# Patient Record
Sex: Male | Born: 1970 | Race: Black or African American | Hispanic: No | Marital: Married | Smoking: Current every day smoker
Health system: Southern US, Community
[De-identification: ages and names within clinical notes are randomized; demographics above are authoritative.]

## PROBLEM LIST (undated history)

## (undated) DIAGNOSIS — I10 Essential (primary) hypertension: Secondary | ICD-10-CM

## (undated) DIAGNOSIS — I639 Cerebral infarction, unspecified: Secondary | ICD-10-CM

---

## 2015-08-13 ENCOUNTER — Emergency Department: Payer: Self-pay

## 2015-08-13 ENCOUNTER — Emergency Department
Admission: EM | Admit: 2015-08-13 | Discharge: 2015-08-13 | Disposition: A | Discharge status: Home / Self Care | Payer: Self-pay | Attending: Emergency Medicine | Admitting: Emergency Medicine

## 2015-08-13 ENCOUNTER — Encounter: Payer: Self-pay | Admitting: *Deleted

## 2015-08-13 DIAGNOSIS — F17210 Nicotine dependence, cigarettes, uncomplicated: Secondary | ICD-10-CM | POA: Insufficient documentation

## 2015-08-13 DIAGNOSIS — R079 Chest pain, unspecified: Secondary | ICD-10-CM | POA: Insufficient documentation

## 2015-08-13 LAB — BASIC METABOLIC PANEL
ANION GAP: 5 (ref 5–15)
BUN: 12 mg/dL (ref 6–20)
CALCIUM: 8.7 mg/dL — AB (ref 8.9–10.3)
CO2: 31 mmol/L (ref 22–32)
CREATININE: 1.11 mg/dL (ref 0.61–1.24)
Chloride: 105 mmol/L (ref 101–111)
Glucose, Bld: 84 mg/dL (ref 65–99)
Potassium: 3.6 mmol/L (ref 3.5–5.1)
Sodium: 141 mmol/L (ref 135–145)

## 2015-08-13 LAB — TROPONIN I: TROPONIN I: 0.03 ng/mL (ref <0.031)

## 2015-08-13 LAB — CBC
HCT: 40 % (ref 40.0–52.0)
HEMOGLOBIN: 13.1 g/dL (ref 13.0–18.0)
MCH: 25.6 pg — AB (ref 26.0–34.0)
MCHC: 32.7 g/dL (ref 32.0–36.0)
MCV: 78.1 fL — ABNORMAL LOW (ref 80.0–100.0)
PLATELETS: 232 10*3/uL (ref 150–440)
RBC: 5.13 MIL/uL (ref 4.40–5.90)
RDW: 15.9 % — ABNORMAL HIGH (ref 11.5–14.5)
WBC: 15.1 10*3/uL — ABNORMAL HIGH (ref 3.8–10.6)

## 2015-08-13 MED ORDER — LORAZEPAM 1 MG PO TABS: 1.0000 mg | ORAL_TABLET | Freq: Two times a day (BID) | ORAL | Status: AC

## 2015-08-13 NOTE — Discharge Instructions (Signed)
Nonspecific Chest Pain  °Chest pain can be caused by many different conditions. There is always a chance that your pain could be related to something serious, such as a heart attack or a blood clot in your lungs. Chest pain can also be caused by conditions that are not life-threatening. If you have chest pain, it is very important to follow up with your health care provider. °CAUSES  °Chest pain can be caused by: °· Heartburn. °· Pneumonia or bronchitis. °· Anxiety or stress. °· Inflammation around your heart (pericarditis) or lung (pleuritis or pleurisy). °· A blood clot in your lung. °· A collapsed lung (pneumothorax). It can develop suddenly on its own (spontaneous pneumothorax) or from trauma to the chest. °· Shingles infection (varicella-zoster virus). °· Heart attack. °· Damage to the bones, muscles, and cartilage that make up your chest wall. This can include: °¨ Bruised bones due to injury. °¨ Strained muscles or cartilage due to frequent or repeated coughing or overwork. °¨ Fracture to one or more ribs. °¨ Sore cartilage due to inflammation (costochondritis). °RISK FACTORS  °Risk factors for chest pain may include: °· Activities that increase your risk for trauma or injury to your chest. °· Respiratory infections or conditions that cause frequent coughing. °· Medical conditions or overeating that can cause heartburn. °· Heart disease or family history of heart disease. °· Conditions or health behaviors that increase your risk of developing a blood clot. °· Having had chicken pox (varicella zoster). °SIGNS AND SYMPTOMS °Chest pain can feel like: °· Burning or tingling on the surface of your chest or deep in your chest. °· Crushing, pressure, aching, or squeezing pain. °· Dull or sharp pain that is worse when you move, cough, or take a deep breath. °· Pain that is also felt in your back, neck, shoulder, or arm, or pain that spreads to any of these areas. °Your chest pain may come and go, or it may stay  constant. °DIAGNOSIS °Lab tests or other studies may be needed to find the cause of your pain. Your health care provider may have you take a test called an ambulatory ECG (electrocardiogram). An ECG records your heartbeat patterns at the time the test is performed. You may also have other tests, such as: °· Transthoracic echocardiogram (TTE). During echocardiography, sound waves are used to create a picture of all of the heart structures and to look at how blood flows through your heart. °· Transesophageal echocardiogram (TEE). This is a more advanced imaging test that obtains images from inside your body. It allows your health care provider to see your heart in finer detail. °· Cardiac monitoring. This allows your health care provider to monitor your heart rate and rhythm in real time. °· Holter monitor. This is a portable device that records your heartbeat and can help to diagnose abnormal heartbeats. It allows your health care provider to track your heart activity for several days, if needed. °· Stress tests. These can be done through exercise or by taking medicine that makes your heart beat more quickly. °· Blood tests. °· Imaging tests. °TREATMENT  °Your treatment depends on what is causing your chest pain. Treatment may include: °· Medicines. These may include: °¨ Acid blockers for heartburn. °¨ Anti-inflammatory medicine. °¨ Pain medicine for inflammatory conditions. °¨ Antibiotic medicine, if an infection is present. °¨ Medicines to dissolve blood clots. °¨ Medicines to treat coronary artery disease. °· Supportive care for conditions that do not require medicines. This may include: °¨ Resting. °¨ Applying heat   or cold packs to injured areas. °¨ Limiting activities until pain decreases. °HOME CARE INSTRUCTIONS °· If you were prescribed an antibiotic medicine, finish it all even if you start to feel better. °· Avoid any activities that bring on chest pain. °· Do not use any tobacco products, including  cigarettes, chewing tobacco, or electronic cigarettes. If you need help quitting, ask your health care provider. °· Do not drink alcohol. °· Take medicines only as directed by your health care provider. °· Keep all follow-up visits as directed by your health care provider. This is important. This includes any further testing if your chest pain does not go away. °· If heartburn is the cause for your chest pain, you may be told to keep your head raised (elevated) while sleeping. This reduces the chance that acid will go from your stomach into your esophagus. °· Make lifestyle changes as directed by your health care provider. These may include: °¨ Getting regular exercise. Ask your health care provider to suggest some activities that are safe for you. °¨ Eating a heart-healthy diet. A registered dietitian can help you to learn healthy eating options. °¨ Maintaining a healthy weight. °¨ Managing diabetes, if necessary. °¨ Reducing stress. °SEEK MEDICAL CARE IF: °· Your chest pain does not go away after treatment. °· You have a rash with blisters on your chest. °· You have a fever. °SEEK IMMEDIATE MEDICAL CARE IF:  °· Your chest pain is worse. °· You have an increasing cough, or you cough up blood. °· You have severe abdominal pain. °· You have severe weakness. °· You faint. °· You have chills. °· You have sudden, unexplained chest discomfort. °· You have sudden, unexplained discomfort in your arms, back, neck, or jaw. °· You have shortness of breath at any time. °· You suddenly start to sweat, or your skin gets clammy. °· You feel nauseous or you vomit. °· You suddenly feel light-headed or dizzy. °· Your heart begins to beat quickly, or it feels like it is skipping beats. °These symptoms may represent a serious problem that is an emergency. Do not wait to see if the symptoms will go away. Get medical help right away. Call your local emergency services (911 in the [place] Do not drive yourself to the hospital. °  °This  information is not intended to replace advice given to you by your health care provider. Make sure you discuss any questions you have with your health care provider. °  °Document Released: 03/24/2005 Document Revised: 07/05/2014 Document Reviewed: 01/18/2014 °Elsevier Interactive Patient Education ©2016 [employer] ° °

## 2015-08-13 NOTE — ED Notes (Signed)
Pt in via triage with complaints of intermittent chest pain on left side which radiates to left shoulder/arm.  Pt denies any other symptoms.  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  MD at bedside.

## 2015-08-13 NOTE — ED Notes (Signed)
Pt reports having episodes of chest pain for the last month, pt denies any other symptoms, pt describes pain as left sided chest pain

## 2015-08-13 NOTE — ED Provider Notes (Signed)
[facility] Emergency Department Provider Note     Time seen: ----------------------------------------- 2:29 PM on 08/13/2015 -----------------------------------------    I have reviewed the triage vital signs and the nursing notes.   HISTORY  Chief Complaint Chest Pain    HPI Gordon Freeman is a 45 y.o. 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 Nothing makes his symptoms better or worse, has had recent viral illness with cough. Patient denies any significant chest pain at this time.   History reviewed. No pertinent past medical history.  There are no active problems to display for this patient.   History reviewed. No pertinent past surgical history.  Allergies Review of patient's allergies indicates no known allergies.  Social History Social History  Substance Use Topics  . Smoking status: Current Every Day Smoker -- 1.00 packs/day    Types: Cigarettes  . Smokeless tobacco: None  . Alcohol Use: No    Review of Systems Constitutional: Negative for fever. Eyes: Negative for visual changes. ENT: Negative for sore throat. Cardiovascular: Positive for chest pain Respiratory: Negative for shortness of breath. Gastrointestinal: Negative for abdominal pain, vomiting and diarrhea. Genitourinary: Negative for dysuria. Musculoskeletal: Negative for back pain. Skin: Negative for rash. Neurological: Negative for headaches, focal weakness or numbness.  10-point ROS otherwise negative.  ____________________________________________   PHYSICAL EXAM:  VITAL SIGNS: ED Triage Vitals  Enc Vitals Group     BP 08/13/15 1211 158/80 mmHg     Pulse Rate 08/13/15 1211 85     Resp 08/13/15 1211 20     Temp 08/13/15 1211 99.5 F (37.5 C)     Temp Source 08/13/15 1211 Oral     SpO2 08/13/15 1211 98 %     Weight 08/13/15 1211 335  lb (151.955 kg)     Height 08/13/15 1211  (1.88 m)     Head Cir --      Peak Flow --      Pain Score 08/13/15 1216 4     Pain Loc --      Pain Edu? --      Excl. in GC? --     Constitutional: Alert and oriented. Well appearing and in no distress. Eyes: Conjunctivae are normal. PERRL. Normal extraocular movements. ENT   Head: Normocephalic and atraumatic.   Nose: No congestion/rhinnorhea.   Mouth/Throat: Mucous membranes are moist.   Neck: No stridor. Cardiovascular: Normal rate, regular rhythm. Normal and symmetric distal pulses are present in all extremities. No murmurs, rubs, or gallops. Respiratory: Normal respiratory effort without tachypnea nor retractions. Breath sounds are clear and equal bilaterally. No wheezes/rales/rhonchi. Gastrointestinal: Soft and nontender. No distention. No abdominal bruits.  Musculoskeletal: Nontender with normal range of motion in all extremities. No joint effusions.  No lower extremity tenderness nor edema. Neurologic:  Normal speech and language. No gross focal neurologic deficits are appreciated.  Skin:  Skin is warm, dry and intact. No rash noted. Psychiatric: Mood and affect are normal. Speech and behavior are normal. Patient exhibits appropriate insight and judgment. ____________________________________________  EKG: Interpreted by me. Normal sinus rhythm rate 84 bpm, normal PR interval, normal QRS, normal QT interval. Normal axis. Nonspecific T-wave changes.  ____________________________________________  ED COURSE:  Pertinent labs & imaging results that were available during my care of the patient were reviewed by me and considered in my medical decision making (see chart for  details). Patient is no acute distress, will check cardiac labs and reevaluate. ____________________________________________    LABS (pertinent positives/negatives)  Labs Reviewed  BASIC METABOLIC PANEL - Abnormal; Notable for the following:     Calcium 8.7 (*)    All other components within normal limits  CBC - Abnormal; Notable for the following:    WBC 15.1 (*)    MCV 78.1 (*)    MCH 25.6 (*)    RDW 15.9 (*)    All other components within normal limits  TROPONIN I    RADIOLOGY  Chest x-ray is unremarkable  ____________________________________________  FINAL ASSESSMENT AND PLAN  Chest pain  Plan: Patient with labs and imaging as dictated above. Patient is in no acute distress, is low risk for ACS. Heart score is low. He'll be discharged with outpatient referral to a cardiologist.   Emily Filbert, MD   Emily Filbert, MD 08/13/15 [phone]

## 2016-11-09 ENCOUNTER — Emergency Department: Payer: Self-pay

## 2016-11-09 ENCOUNTER — Encounter: Payer: Self-pay | Admitting: Emergency Medicine

## 2016-11-09 ENCOUNTER — Inpatient Hospital Stay
Admission: EM | Admit: 2016-11-09 | Discharge: 2016-11-10 | DRG: 066 | Disposition: A | Discharge status: Home / Self Care | Payer: Self-pay | Attending: Internal Medicine | Admitting: Internal Medicine

## 2016-11-09 DIAGNOSIS — F17210 Nicotine dependence, cigarettes, uncomplicated: Secondary | ICD-10-CM | POA: Diagnosis present

## 2016-11-09 DIAGNOSIS — R29810 Facial weakness: Secondary | ICD-10-CM | POA: Diagnosis present

## 2016-11-09 DIAGNOSIS — I10 Essential (primary) hypertension: Secondary | ICD-10-CM | POA: Diagnosis present

## 2016-11-09 DIAGNOSIS — Z9114 Patient's other noncompliance with medication regimen: Secondary | ICD-10-CM

## 2016-11-09 DIAGNOSIS — E876 Hypokalemia: Secondary | ICD-10-CM | POA: Diagnosis present

## 2016-11-09 DIAGNOSIS — R4781 Slurred speech: Secondary | ICD-10-CM | POA: Diagnosis present

## 2016-11-09 DIAGNOSIS — Z823 Family history of stroke: Secondary | ICD-10-CM

## 2016-11-09 DIAGNOSIS — R278 Other lack of coordination: Secondary | ICD-10-CM | POA: Diagnosis present

## 2016-11-09 DIAGNOSIS — I639 Cerebral infarction, unspecified: Principal | ICD-10-CM | POA: Diagnosis present

## 2016-11-09 DIAGNOSIS — R531 Weakness: Secondary | ICD-10-CM

## 2016-11-09 HISTORY — DX: Essential (primary) hypertension: I10

## 2016-11-09 LAB — PROTIME-INR
INR: 1.05
Prothrombin Time: 13.7 seconds (ref 11.4–15.2)

## 2016-11-09 LAB — COMPREHENSIVE METABOLIC PANEL
ALK PHOS: 70 U/L (ref 38–126)
ALT: 19 U/L (ref 17–63)
ANION GAP: 7 (ref 5–15)
AST: 24 U/L (ref 15–41)
Albumin: 4.5 g/dL (ref 3.5–5.0)
BILIRUBIN TOTAL: 0.5 mg/dL (ref 0.3–1.2)
BUN: 16 mg/dL (ref 6–20)
CALCIUM: 9.4 mg/dL (ref 8.9–10.3)
CO2: 26 mmol/L (ref 22–32)
Chloride: 107 mmol/L (ref 101–111)
Creatinine, Ser: 1.1 mg/dL (ref 0.61–1.24)
Glucose, Bld: 115 mg/dL — ABNORMAL HIGH (ref 65–99)
POTASSIUM: 3.4 mmol/L — AB (ref 3.5–5.1)
Sodium: 140 mmol/L (ref 135–145)
TOTAL PROTEIN: 7.8 g/dL (ref 6.5–8.1)

## 2016-11-09 LAB — DIFFERENTIAL
Basophils Absolute: 0.2 10*3/uL — ABNORMAL HIGH (ref 0–0.1)
Basophils Relative: 1 %
EOS ABS: 0.2 10*3/uL (ref 0–0.7)
EOS PCT: 1 %
LYMPHS ABS: 3 10*3/uL (ref 1.0–3.6)
LYMPHS PCT: 25 %
MONO ABS: 0.8 10*3/uL (ref 0.2–1.0)
MONOS PCT: 7 %
Neutro Abs: 8.1 10*3/uL — ABNORMAL HIGH (ref 1.4–6.5)
Neutrophils Relative %: 66 %

## 2016-11-09 LAB — CBC
HCT: 40.9 % (ref 40.0–52.0)
HEMOGLOBIN: 13.3 g/dL (ref 13.0–18.0)
MCH: 25.7 pg — AB (ref 26.0–34.0)
MCHC: 32.5 g/dL (ref 32.0–36.0)
MCV: 79.2 fL — AB (ref 80.0–100.0)
Platelets: 252 10*3/uL (ref 150–440)
RBC: 5.17 MIL/uL (ref 4.40–5.90)
RDW: 16.8 % — ABNORMAL HIGH (ref 11.5–14.5)
WBC: 12.3 10*3/uL — ABNORMAL HIGH (ref 3.8–10.6)

## 2016-11-09 LAB — APTT: aPTT: 27 seconds (ref 24–36)

## 2016-11-09 LAB — TROPONIN I

## 2016-11-09 MED ORDER — SENNOSIDES-DOCUSATE SODIUM 8.6-50 MG PO TABS: 1.0000 | ORAL_TABLET | Freq: Every evening | ORAL | Status: DC | PRN

## 2016-11-09 MED ORDER — POTASSIUM CHLORIDE 10 MEQ/100ML IV SOLN
10.0000 meq | INTRAVENOUS | Status: DC
  Administered 2016-11-10: 10 meq via INTRAVENOUS
  Filled 2016-11-09 (×3): qty 100

## 2016-11-09 MED ORDER — ENOXAPARIN SODIUM 40 MG/0.4ML ~~LOC~~ SOLN
40.0000 mg | Freq: Two times a day (BID) | SUBCUTANEOUS | Status: DC
  Administered 2016-11-10: 40 mg via SUBCUTANEOUS
  Filled 2016-11-09: qty 0.4

## 2016-11-09 MED ORDER — NICOTINE 21 MG/24HR TD PT24
21.0000 mg | MEDICATED_PATCH | Freq: Every day | TRANSDERMAL | Status: DC
  Administered 2016-11-09 – 2016-11-10 (×2): 21 mg via TRANSDERMAL
  Filled 2016-11-09: qty 1

## 2016-11-09 MED ORDER — ASPIRIN 300 MG RE SUPP: 300.0000 mg | Freq: Every day | RECTAL | Status: DC

## 2016-11-09 MED ORDER — NICOTINE 21 MG/24HR TD PT24
MEDICATED_PATCH | TRANSDERMAL | Status: AC
  Filled 2016-11-09: qty 1

## 2016-11-09 MED ORDER — ACETAMINOPHEN 160 MG/5ML PO SOLN: 650.0000 mg | ORAL | Status: DC | PRN

## 2016-11-09 MED ORDER — SODIUM CHLORIDE 0.9 % IV SOLN
INTRAVENOUS | Status: DC
  Administered 2016-11-09: via INTRAVENOUS

## 2016-11-09 MED ORDER — ACETAMINOPHEN 650 MG RE SUPP: 650.0000 mg | RECTAL | Status: DC | PRN

## 2016-11-09 MED ORDER — ASPIRIN 325 MG PO TABS
325.0000 mg | ORAL_TABLET | Freq: Every day | ORAL | Status: DC
  Administered 2016-11-10: 10:00:00 325 mg via ORAL
  Filled 2016-11-09: qty 1

## 2016-11-09 MED ORDER — STROKE: EARLY STAGES OF RECOVERY BOOK
Freq: Once | Status: AC
  Administered 2016-11-09

## 2016-11-09 MED ORDER — LORAZEPAM 2 MG/ML IJ SOLN
INTRAMUSCULAR | Status: AC
  Administered 2016-11-09: 2 mg via INTRAVENOUS
  Filled 2016-11-09: qty 1

## 2016-11-09 MED ORDER — ACETAMINOPHEN 325 MG PO TABS: 650.0000 mg | ORAL_TABLET | ORAL | Status: DC | PRN

## 2016-11-09 MED ORDER — ASPIRIN 81 MG PO CHEW
324.0000 mg | CHEWABLE_TABLET | Freq: Once | ORAL | Status: AC
  Administered 2016-11-09: 324 mg via ORAL
  Filled 2016-11-09: qty 4

## 2016-11-09 MED ORDER — LORAZEPAM 2 MG/ML IJ SOLN
2.0000 mg | Freq: Once | INTRAMUSCULAR | Status: AC
  Administered 2016-11-09: 2 mg via INTRAVENOUS

## 2016-11-09 NOTE — ED Notes (Addendum)
Pt returned from MRI, sts that "I feel like I'm in a coffin". MD Good man notified

## 2016-11-09 NOTE — ED Notes (Signed)
Patient transported to MRI 

## 2016-11-09 NOTE — ED Triage Notes (Signed)
Pt started with chest pain and left facial droop yesterday.  Has left side arm weakness.  Only Hx HTN.  Only talking from right side of mouth.  Pt in wheelchair, unsure about gait.

## 2016-11-09 NOTE — ED Provider Notes (Signed)
[facility] Emergency Department Provider Note   ____________________________________________   I have reviewed the triage vital signs and the nursing notes.   HISTORY  Chief Complaint Chest Pain and stroke symptoms   History limited by: Not Limited   HPI Gordon Freeman is a 46 y.o. male who presents to the emergency department today because of concern for left sided weakness. The patient states that the symptoms started yesterday morning, roughly 30 hours prior to my evaluation. The patient first noticed the symptoms while he was driving his car. He noticed he could not use the left side of his body normally. He states that the weakness has continued. It was associated with some chest pain yesterday. The patient additionally noticed change in his speech. The patient denies any severe headache. No fevers or recent illness.   Past Medical History:  Diagnosis Date  . Hypertension     There are no active problems to display for this patient.   History reviewed. No pertinent surgical history.  Prior to Admission medications   Not on File    Allergies Patient has no known allergies.  History reviewed. No pertinent family history.  Social History Social History  Substance Use Topics  . Smoking status: Current Every Day Smoker    Packs/day: 1.00    Types: Cigarettes  . Smokeless tobacco: Never Used  . Alcohol use No    Review of Systems Constitutional: No fever/chills Eyes: No visual changes. ENT: Change in speech. Cardiovascular: Positive for chest pain. Respiratory: Denies shortness of breath. Gastrointestinal: No abdominal pain.  No nausea, no vomiting.  No diarrhea.   Genitourinary: Negative for dysuria. Musculoskeletal: Negative for back pain. Skin: Negative for rash. Neurological: Positive for left sided weakness.  ____________________________________________   PHYSICAL EXAM:  VITAL SIGNS: ED Triage Vitals  Enc Vitals Group   BP 11/09/16 1542 (!) 210/88     Pulse Rate 11/09/16 1542 89     Resp 11/09/16 1542 16     Temp 11/09/16 1542 99.5 F (37.5 C)     Temp Source 11/09/16 1542 Oral     SpO2 11/09/16 1542 98 %     Weight 11/09/16 1541 (!) 335 lb (152 kg)     Height 11/09/16 1541 6\' 1"  (1.854 m)     Head Circumference --      Peak Flow --      Pain Score 11/09/16 1540 6   Constitutional: Alert and oriented. Well appearing and in no distress. Eyes: Conjunctivae are normal.  ENT   Head: Normocephalic and atraumatic.   Nose: No congestion/rhinnorhea.   Mouth/Throat: Mucous membranes are moist.   Neck: No stridor. Hematological/Lymphatic/Immunilogical: No cervical lymphadenopathy. Cardiovascular: Normal rate, regular rhythm.  No murmurs, rubs, or gallops. Respiratory: Normal respiratory effort without tachypnea nor retractions. Breath sounds are clear and equal bilaterally. No wheezes/rales/rhonchi. Gastrointestinal: Soft and non tender. No rebound. No guarding.  Genitourinary: Deferred Musculoskeletal: Normal range of motion in all extremities. No lower extremity edema. Neurologic:  Slightly slurred speech. Left sided facial droop. Able to elevate bilateral eyebrows. Slight pronator drift to left upper extremity. Sensation grossly intact. Skin:  Skin is warm, dry and intact. No rash noted. Psychiatric: Mood and affect are normal. Speech and behavior are normal. Patient exhibits appropriate insight and judgment.  ____________________________________________    LABS (pertinent positives/negatives)  Labs Reviewed  CBC - Abnormal; Notable for the following:       Result Value   WBC 12.3 (*)    MCV  79.2 (*)    MCH 25.7 (*)    RDW 16.8 (*)    All other components within normal limits  DIFFERENTIAL - Abnormal; Notable for the following:    Neutro Abs 8.1 (*)    Basophils Absolute 0.2 (*)    All other components within normal limits  COMPREHENSIVE METABOLIC PANEL - Abnormal; Notable for the  following:    Potassium 3.4 (*)    Glucose, Bld 115 (*)    All other components within normal limits  PROTIME-INR  APTT  TROPONIN I  CBG MONITORING, ED     ____________________________________________   EKG  I, Phineas Semen, attending physician, personally viewed and interpreted this EKG  EKG Time: 1542 Rate: 86 Rhythm: normal sinus rhythm Axis: normal Intervals: qtc 404 QRS: narrow ST changes: no st elevation Impression: normal ekg    ____________________________________________    RADIOLOGY  CT head IMPRESSION:  Negative noncontrast CT HEAD.   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    Mild chronic small vessel ischemic disease.    MRA HEAD: No emergent large vessel occlusion.    Moderate intracranial atherosclerosis.     ____________________________________________   PROCEDURES  Procedures  ____________________________________________   INITIAL IMPRESSION / ASSESSMENT AND PLAN / ED COURSE  Pertinent labs & imaging results that were available during my care of the patient were reviewed by me and considered in my medical decision making (see chart for details).  Patient presented to the emergency department today because of concerns for left sided weakness and left sided facial droop. Exam was concerning for some left sided weakness as well as left facial droop. Patient however had symmetric eyebrow functioning. Patient's CT did not show any acute findings however MRI was pursued given concern for CVA. This did show an acute stroke. Patient will be admitted to the hospital service for further workup and evaluation.  ____________________________________________   FINAL CLINICAL IMPRESSION(S) / ED DIAGNOSES  Final diagnoses:  Left-sided weakness  Cerebrovascular accident (CVA), unspecified mechanism (HCC)     Note: This dictation was prepared with Dragon dictation. Any transcriptional errors that  result from this process are unintentional     Phineas Semen, MD 11/09/16 1919

## 2016-11-09 NOTE — H&P (Signed)
[facility] - [place] @ [facility] Admission History and Physical [employer] Jiali Linney, D.O.  ---------------------------------------------------------------------------------------------------------------------   PATIENT NAME: Gordon [place] MR#: [id number] DATE OF BIRTH: [birth year] DATE OF ADMISSION: 11/09/2016 PRIMARY CARE PHYSICIAN: Patient, No Pcp Per  REQUESTING/REFERRING PHYSICIAN: ED Dr. Derrill KayGoodman  CHIEF COMPLAINT: Chief Complaint  Patient presents with  . Chest Pain  . stroke symptoms    HISTORY OF PRESENT ILLNESS: Gordon Moatsric Pulcini is a 46 y.o. male with a known history of HTN was in a usual state of health until more than one day ago when he developed chest pain, facial droop and left arm weakness, couldn't drive his car.  Noted slurring of his speech and speech difficulty. Patient denies lower extremity weakness but his family indicates that he stumbled into the emergency department because of left lower extremity weakness. Patient believes his symptoms are improving. Family agrees that speech is starting to clear up.  Otherwise there has been no change in status. Patient has been taking medication as prescribed and there has been no recent change in medication or diet.  There has been no recent illness, travel or sick contacts.    Patient denies fevers/chills, weakness, dizziness, chest pain, shortness of breath, N/V/C/D, abdominal pain, dysuria/frequency, changes in mental status.   EMS/ED COURSE:  Patient received Aspirin 324 and Ativan 2mg  IV  PAST MEDICAL HISTORY: Past Medical History:  Diagnosis Date  . Hypertension      PAST SURGICAL HISTORY: History reviewed. No pertinent surgical history.    SOCIAL HISTORY: Social History  Substance Use Topics  . Smoking status: Current Every Day Smoker    Packs/day: 1.00    Types: Cigarettes  . Smokeless tobacco: Never Used  . Alcohol use No     FAMILY HISTORY: Mother and father with HTN.  Mother deceased From cancer,  unknown type.   MEDICATIONS AT HOME: Prior to Admission medications   Not on File      DRUG ALLERGIES: No Known Allergies   REVIEW OF SYSTEMS: CONSTITUTIONAL: No fever/chills, fatigue, weakness, weight gain/loss, headache EYES: No blurry or double vision. ENT: No tinnitus, postnasal drip, redness or soreness of the oropharynx. RESPIRATORY: No cough, wheeze, hemoptysis, dyspnea. CARDIOVASCULAR: Positive chest pain. Negative orthopnea, palpitations, syncope. GASTROINTESTINAL: No nausea, vomiting, constipation, diarrhea, abdominal pain, hematemesis, melena or hematochezia. GENITOURINARY: No dysuria or hematuria. ENDOCRINE: No polyuria or nocturia. No heat or cold intolerance. HEMATOLOGY: No anemia, bruising, bleeding. INTEGUMENTARY: No rashes, ulcers, lesions. MUSCULOSKELETAL: No arthritis, swelling, gout. NEUROLOGIC: Positive LUE numbness, tingling, weakness. Positive speech disturbance No seizure-type activity. PSYCHIATRIC: No anxiety, depression, insomnia.  PHYSICAL EXAMINATION: VITAL SIGNS: Blood pressure (!) 184/95, pulse 76, temperature 99.5 F (37.5 C), temperature source Oral, resp. rate 17, height 6\' 1"  (1.854 m), weight (!) 152 kg (335 lb), SpO2 99 %.  GENERAL: 46 y.o.-year-old male patient, well-developed, well-nourished lying in the bed in no acute distress.  Pleasant and cooperative.   HEENT: Head atraumatic, normocephalic. Pupils equal, round, reactive to light and accommodation. No scleral icterus. Extraocular muscles intact. Nares are patent. Oropharynx is clear. Mucus membranes moist. NECK: Supple, full range of motion. No JVD, no bruit heard. No thyroid enlargement, no tenderness, no cervical lymphadenopathy. CHEST: Normal breath sounds bilaterally. No wheezing, rales, rhonchi or crackles. No use of accessory muscles of respiration.  No reproducible chest wall tenderness.  CARDIOVASCULAR: S1, S2 normal. No murmurs, rubs, or gallops. Cap refill <2 seconds. ABDOMEN:  Soft, nontender, nondistended. No rebound, guarding, rigidity. Normoactive bowel sounds present in all four quadrants.  No organomegaly or mass. EXTREMITIES: Full range of motion. No pedal edema, cyanosis, or clubbing. NEUROLOGIC: Patient is awake alert and oriented 3. There is a left-sided facial droop especially of the left lower lips. Speech is mildly garbled. There is some tongue deviation to the left. Cranial nerves II through XII are grossly intact with no focal sensorimotor deficit. Muscle strength 5/5 in all extremities. Sensation intact. Gait not checked. PSYCHIATRIC: The patient is alert and oriented x 3. Normal affect, mood, thought content. SKIN: Warm, dry, and intact without obvious rash, lesion, or ulcer.  LABORATORY PANEL:  CBC  Recent Labs Lab 11/09/16 1541  WBC 12.3*  HGB 13.3  HCT 40.9  PLT 252   ----------------------------------------------------------------------------------------------------------------- Chemistries  Recent Labs Lab 11/09/16 1541  NA 140  K 3.4*  CL 107  CO2 26  GLUCOSE 115*  BUN 16  CREATININE 1.10  CALCIUM 9.4  AST 24  ALT 19  ALKPHOS 70  BILITOT 0.5   ------------------------------------------------------------------------------------------------------------------ Cardiac Enzymes  Recent Labs Lab 11/09/16 1541  TROPONINI <0.03   ------------------------------------------------------------------------------------------------------------------  RADIOLOGY: Ct Head Wo Contrast  Result Date: 11/09/2016 CLINICAL DATA:  Chest pain and LEFT facial droop beginning yesterday, LEFT arm weakness. History of hypertension. EXAM: CT HEAD WITHOUT CONTRAST TECHNIQUE: Contiguous axial images were obtained from the base of the skull through the vertex without intravenous contrast. COMPARISON:  None. FINDINGS: BRAIN: No intraparenchymal hemorrhage, mass effect nor midline shift. The ventricles and sulci are normal. No acute large vascular  territory infarcts. No abnormal extra-axial fluid collections. Basal cisterns are patent. VASCULAR: Trace calcific atherosclerosis carotid siphon. SKULL/SOFT TISSUES: No skull fracture. No significant soft tissue swelling. ORBITS/SINUSES: The included ocular globes and orbital contents are normal.The mastoid aircells and included paranasal sinuses are well-aerated. OTHER: None. IMPRESSION: Negative noncontrast CT HEAD. Electronically Signed   By: Awilda Metro M.D.   On: 11/09/2016 15:58   Mr Angiogram Head W Contrast  Result Date: 11/09/2016 CLINICAL DATA:  Chest pain and LEFT facial droop beginning yesterday, LEFT arm weakness. History of hypertension. EXAM: MRI HEAD WITHOUT CONTRAST MRA HEAD WITHOUT CONTRAST TECHNIQUE: Multiplanar, multiecho pulse sequences of the brain and surrounding structures were obtained without intravenous contrast. Angiographic images of the head were obtained using MRA technique without contrast. COMPARISON:  CT HEAD Nov 09, 2016 at 1550 hours FINDINGS: MRI HEAD FINDINGS BRAIN: 8 mm ovoid reduced diffusion and bright T2 signal RIGHT posterior limb of the internal capsule with low ADC values. Ventricles and sulci are normal for patient's age. No midline shift, mass effect or masses. A few punctate supratentorial white matter FLAIR T2 hyperintensities. No susceptibility artifact to suggest hemorrhage. The ventricles and sulci are normal for patient's age. No suspicious parenchymal signal, masses or mass effect. No abnormal extra-axial fluid collections. VASCULAR: Normal major intracranial vascular flow voids present at skull base. SKULL AND UPPER CERVICAL SPINE: No abnormal sellar expansion. No suspicious calvarial bone marrow signal. Craniocervical junction maintained. SINUSES/ORBITS: The mastoid air-cells and included paranasal sinuses are well-aerated. The included ocular globes and orbital contents are non-suspicious. OTHER: None. MRA HEAD FINDINGS- Mild motion degraded  examination. ANTERIOR CIRCULATION: Normal flow related enhancement of the included cervical, petrous, cavernous and supraclinoid internal carotid arteries. Patent anterior communicating artery. Flow related enhancement of the anterior and middle cerebral arteries, including distal segments, moderate luminal regularity compatible with atherosclerosis. No large vessel occlusion, high-grade stenosis, aneurysm. POSTERIOR CIRCULATION: LEFT vertebral artery is dominant. RIGHT vertebral artery terminates in the posterior inferior cerebellar artery. Basilar artery is patent,  with normal flow related enhancement of the main branch vessels. Flow related enhancement of the posterior cerebral arteries, moderate luminal irregularity RIGHT posterior cerebral artery compatible with atherosclerosis. Bilateral posterior communicating arteries present. No large vessel occlusion, high-grade stenosis, aneurysm. ANATOMIC VARIANTS: None. 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 Mild chronic small vessel ischemic disease. MRA HEAD: No emergent large vessel occlusion. Moderate intracranial atherosclerosis. Acute findings discussed with and reconfirmed by [facility] GOODMAN on 11/09/2016 at 6:40 pm. Electronically Signed   By: Awilda Metro M.D.   On: 11/09/2016 19:10   Mr Brain Wo Contrast  Result Date: 11/09/2016 CLINICAL DATA:  Chest pain and LEFT facial droop beginning yesterday, LEFT arm weakness. History of hypertension. EXAM: MRI HEAD WITHOUT CONTRAST MRA HEAD WITHOUT CONTRAST TECHNIQUE: Multiplanar, multiecho pulse sequences of the brain and surrounding structures were obtained without intravenous contrast. Angiographic images of the head were obtained using MRA technique without contrast. COMPARISON:  CT HEAD Nov 09, 2016 at 1550 hours FINDINGS: MRI HEAD FINDINGS BRAIN: 8 mm ovoid reduced diffusion and bright T2 signal RIGHT posterior limb of the internal capsule with low ADC values. Ventricles and  sulci are normal for patient's age. No midline shift, mass effect or masses. A few punctate supratentorial white matter FLAIR T2 hyperintensities. No susceptibility artifact to suggest hemorrhage. The ventricles and sulci are normal for patient's age. No suspicious parenchymal signal, masses or mass effect. No abnormal extra-axial fluid collections. VASCULAR: Normal major intracranial vascular flow voids present at skull base. SKULL AND UPPER CERVICAL SPINE: No abnormal sellar expansion. No suspicious calvarial bone marrow signal. Craniocervical junction maintained. SINUSES/ORBITS: The mastoid air-cells and included paranasal sinuses are well-aerated. The included ocular globes and orbital contents are non-suspicious. OTHER: None. MRA HEAD FINDINGS- Mild motion degraded examination. ANTERIOR CIRCULATION: Normal flow related enhancement of the included cervical, petrous, cavernous and supraclinoid internal carotid arteries. Patent anterior communicating artery. Flow related enhancement of the anterior and middle cerebral arteries, including distal segments, moderate luminal regularity compatible with atherosclerosis. No large vessel occlusion, high-grade stenosis, aneurysm. POSTERIOR CIRCULATION: LEFT vertebral artery is dominant. RIGHT vertebral artery terminates in the posterior inferior cerebellar artery. Basilar artery is patent, with normal flow related enhancement of the main branch vessels. Flow related enhancement of the posterior cerebral arteries, moderate luminal irregularity RIGHT posterior cerebral artery compatible with atherosclerosis. Bilateral posterior communicating arteries present. No large vessel occlusion, high-grade stenosis, aneurysm. ANATOMIC VARIANTS: None. 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 Mild chronic small vessel ischemic disease. MRA HEAD: No emergent large vessel occlusion. Moderate intracranial atherosclerosis. Acute findings discussed with and  reconfirmed by [facility] GOODMAN on 11/09/2016 at 6:40 pm. Electronically Signed   By: Awilda Metro M.D.   On: 11/09/2016 19:10    EKG: NSR@ 86bpm, normal axis, nonspecific ST-T wave changes.   IMPRESSION AND PLAN:  This is a 46 y.o. male with a history of HTN now being admitted with:  1. Acute CVA right internal capsule - Admit telemetry observation for neuro workup including: - Studies: Echo, Carotids - Labs: CBC, BMP, Lipids, TFTs, A1C, trops - Nursing: Neurochecks, O2, dysphagia screen, permissive hypertension - Meds: Daily aspirin 81mg .    2. Hypokalemia, mild - Replace IV  Admission status: Observation, tele Diet: NPO Fluids: IVNS@75cc /hr.   Consults: Neurology, PT/OT, S/S consults.  Routine DVT Px: Lovenox, SCDs, early ambulation Code Status: Full  All the records are reviewed and case discussed with ED provider. Management plans discussed with the patient, his  wife and three daughters in the room who express understanding and agree with plan of care.   TOTAL TIME TAKING CARE OF THIS PATIENT: 60 minutes.   Agness Sibrian D.O. on 11/09/2016 at 8:38 PM Between 7am to 6pm - Pager - [phone] After 6pm go to [email or web address] - Social research officer, government [facility] [place] [facility] Office [phone] CC: Primary care physician; Patient, No Pcp Per     Note: This dictation was prepared with Dragon dictation along with smaller phrase technology. Any transcriptional errors that result from this process are unintentional.

## 2016-11-09 NOTE — ED Notes (Signed)
Pt returned from MRI, family at bedside  

## 2016-11-10 ENCOUNTER — Inpatient Hospital Stay: Payer: Self-pay

## 2016-11-10 ENCOUNTER — Inpatient Hospital Stay
Admit: 2016-11-10 | Discharge: 2016-11-10 | Disposition: A | Discharge status: Home / Self Care | Payer: Self-pay | Attending: Family Medicine | Admitting: Family Medicine

## 2016-11-10 ENCOUNTER — Observation Stay: Payer: Self-pay

## 2016-11-10 DIAGNOSIS — I639 Cerebral infarction, unspecified: Principal | ICD-10-CM

## 2016-11-10 LAB — URINE DRUG SCREEN, QUALITATIVE (ARMC ONLY)
Amphetamines, Ur Screen: NOT DETECTED
BARBITURATES, UR SCREEN: NOT DETECTED
Benzodiazepine, Ur Scrn: NOT DETECTED
CANNABINOID 50 NG, UR ~~LOC~~: POSITIVE — AB
COCAINE METABOLITE, UR ~~LOC~~: NOT DETECTED
MDMA (Ecstasy)Ur Screen: NOT DETECTED
Methadone Scn, Ur: NOT DETECTED
OPIATE, UR SCREEN: NOT DETECTED
PHENCYCLIDINE (PCP) UR S: NOT DETECTED
Tricyclic, Ur Screen: NOT DETECTED

## 2016-11-10 LAB — URINALYSIS, DIPSTICK ONLY
Bilirubin Urine: NEGATIVE
GLUCOSE, UA: NEGATIVE mg/dL
Hgb urine dipstick: NEGATIVE
Ketones, ur: NEGATIVE mg/dL
LEUKOCYTES UA: NEGATIVE
NITRITE: NEGATIVE
PROTEIN: NEGATIVE mg/dL
Specific Gravity, Urine: 1.015 (ref 1.005–1.030)
pH: 6 (ref 5.0–8.0)

## 2016-11-10 LAB — LIPID PANEL
Cholesterol: 190 mg/dL (ref 0–200)
HDL: 23 mg/dL — ABNORMAL LOW (ref >40)
LDL CALC: 148 mg/dL — AB (ref 0–99)
Total CHOL/HDL Ratio: 8.3 RATIO
Triglycerides: 97 mg/dL (ref <150)
VLDL: 19 mg/dL (ref 0–40)

## 2016-11-10 LAB — TROPONIN I
Troponin I: <0.03 ng/mL (ref <0.03)
Troponin I: <0.03 ng/mL (ref <0.03)

## 2016-11-10 LAB — ECHOCARDIOGRAM COMPLETE
Height: 73 in
WEIGHTICAEL: 5209.6 [oz_av]

## 2016-11-10 LAB — ANTITHROMBIN III: ANTITHROMB III FUNC: 102 % (ref 75–120)

## 2016-11-10 LAB — SEDIMENTATION RATE: SED RATE: 6 mm/h (ref 0–15)

## 2016-11-10 MED ORDER — AMLODIPINE BESYLATE 10 MG PO TABS: 10.0000 mg | ORAL_TABLET | Freq: Every day | ORAL | 2 refills | Status: DC

## 2016-11-10 MED ORDER — ASPIRIN EC 81 MG PO TBEC: 81.0000 mg | DELAYED_RELEASE_TABLET | Freq: Every day | ORAL | 2 refills | Status: DC

## 2016-11-10 MED ORDER — NICOTINE 21 MG/24HR TD PT24: 21.0000 mg | MEDICATED_PATCH | Freq: Every day | TRANSDERMAL | 0 refills | Status: DC

## 2016-11-10 MED ORDER — POTASSIUM CHLORIDE CRYS ER 20 MEQ PO TBCR
30.0000 meq | EXTENDED_RELEASE_TABLET | Freq: Once | ORAL | Status: AC
  Administered 2016-11-10: 02:00:00 30 meq via ORAL
  Filled 2016-11-10: qty 1

## 2016-11-10 MED ORDER — ATORVASTATIN CALCIUM 20 MG PO TABS
40.0000 mg | ORAL_TABLET | Freq: Every day | ORAL | Status: DC
  Administered 2016-11-10: 40 mg via ORAL
  Filled 2016-11-10: qty 2

## 2016-11-10 MED ORDER — ATORVASTATIN CALCIUM 40 MG PO TABS: 40.0000 mg | ORAL_TABLET | Freq: Every day | ORAL | 2 refills | Status: DC

## 2016-11-10 NOTE — Progress Notes (Signed)
SLP Cancellation Note  Patient Details Name: Gordon Freeman MRN: [id number] DOB: 10-08-1970   Cancelled treatment:       Reason Eval/Treat Not Completed: SLP screened, no needs identified, will sign off (chart reviewed; consulted pt/family and NSG). Pt denied any difficulty swallowing and is currently on a regular diet; tolerates swallowing pills w/ water per NSG. Pt conversed at conversational level w/out speech deficits noted; pt and family denied any speech-language deficits but note a slight pull in his upper lip. Instructed pt on using Big mouth movements vs mumbled speech, smile big, laugh, and wink exs on that side to aid tone. Instructed pt on f/u w/ Outpatient ST services if he felt necessary after discharge but pt stated he was "fine now".   No further skilled ST services indicated as pt appears near his baseline. Pt agreed. NSG to reconsult if any change in status.    Jerilynn SomKatherine Watson, MS, CCC-SLP Watson,Katherine 11/10/2016, 10:24 AM

## 2016-11-10 NOTE — Plan of Care (Signed)
Problem: Education: Goal: Knowledge of  General Education information/materials will improve Outcome: Progressing Pt likes to be called Gordon Freeman  Past Medical History:  Diagnosis Date  . Hypertension    Pt is well controlled with home medications

## 2016-11-10 NOTE — Progress Notes (Signed)
Patient discharged home per MD order. Prescriptions given to patient. [facility] given to [facility].  All discharge instructions given and all questions answered. Patient verbalized understanding of all discharge orders given.

## 2016-11-10 NOTE — Progress Notes (Signed)
*  PRELIMINARY RESULTS* Echocardiogram 2D Echocardiogram has been performed.  Cristela BlueHege, Vanetta Rule 11/10/2016, 5:28 PM

## 2016-11-10 NOTE — Care Management (Signed)
Admitted to this facility with the diagnosis of CVA. Lives with wife, Gordon Freeman [phone]). Mr. Gordon BentonWooden contact number is [phone].) Last seen a physician at Urgent Care in 2017. Not employed. No insurance.  Takes care of all basic activities of daily living himself, drives. [place]. Never been to [facility].  [facility] given. [facility] States that he and his family will help with medications.  Gordon GreetBrenda s Densil Ottey RN MSN CCM Care Management [phone]

## 2016-11-10 NOTE — Evaluation (Signed)
Physical Therapy Evaluation Patient Details Name: Gordon Freeman MRN: [id number] DOB: Apr 12, 1971 Today's Date: 11/10/2016   History of Present Illness  presented to ER secondary to numbness/paresthesia and weakness of L face, UE > LE; admitted for acute CVA work-up.  MRI significant for R IC infarct.  Clinical Impression  Patient with persistent mild L facial droop, mild deficit in L UE/hand fine motor, coordination.  Mild decrease in L UE ability with bimanual coordination tasks (indicative of mild inattention).  LEs grossly symmetrical and WFL without noted deficit.  Demonstrates ability to complete bed mobility indep; sit/stand, basic transfers, gait (220') and stairs (up/down 6) without rail, mod indep.  Good, symmetrical gait mechanics with fair/good cadence and overall gait speed.  PAtient reports mobility is baseline for him. Did review role of therex and forced use to L UE to promote recovery; reviewed principles of extinction during bimaual tasks and reinforced need for increased awareness/attention to L UE during functional activities requiring bilat UEs.  Patient voiced understanding of all information. No additional PT needs identified at this time, as patient mod indep/baseline level of functional ability.  Will complee initial order at this time.  Please reconsult should needs change.    Follow Up Recommendations No PT follow up    Equipment Recommendations       Recommendations for Other Services       Precautions / Restrictions Precautions Precautions: None Restrictions Weight Bearing Restrictions: No      Mobility  Bed Mobility Overal bed mobility: Independent                Transfers Overall transfer level: Independent                  Ambulation/Gait Ambulation/Gait assistance: Modified independent (Device/Increase time) Ambulation Distance (Feet): 220 Feet         General Gait Details: reciprocal stepping with fair/good cadence and gait  speed; good trunk rotation, arm swing  Stairs Stairs: Yes Stairs assistance: Modified independent (Device/Increase time) Stair Management: No rails Number of Stairs: 6 General stair comments: reciprocal stepping pattern without difficulty  Wheelchair Mobility    Modified Rankin (Stroke Patients Only)       Balance Overall balance assessment: Modified Independent Sitting-balance support: No upper extremity supported;Feet supported Sitting balance-Leahy Scale: Normal     Standing balance support: No upper extremity supported Standing balance-Leahy Scale: Good   Single Leg Stance - Right Leg: 10 Single Leg Stance - Left Leg: 7                         Pertinent Vitals/Pain Pain Assessment: No/denies pain    Home Living Family/patient expects to be discharged to:: Private residence Living Arrangements: Spouse/significant other;Children Available Help at Discharge: Family;Available 24 hours/day Type of Home: Apartment Home Access: Stairs to enter Entrance Stairs-Rails: Right;Left;Can reach both Entrance Stairs-Number of Steps: 10 Home Layout: Multi-level;Able to live on main level with bedroom/bathroom        Prior Function Level of Independence: Independent         Comments: Indep with ADLs, household and community mobility without assist device; working as Health and safety inspector.     Hand Dominance        Extremity/Trunk Assessment   Upper Extremity Assessment Upper Extremity Assessment:  (L UE with slight decrease in fine motor control, coordination; mild inattention/extinguishment with bimanaul tasks.  Mild drift noted L UE)    Lower Extremity Assessment Lower Extremity Assessment: Overall [facility]  for tasks assessed (grossly 5/5 without sensory, coordination deficit)       Communication   Communication: No difficulties (mild L facial droop)  Cognition Arousal/Alertness: Awake/alert Behavior During Therapy: WFL for tasks  assessed/performed Overall Cognitive Status: Within Functional Limits for tasks assessed                                        General Comments      Exercises     Assessment/Plan    PT Assessment Patent does not need any further PT services  PT Problem List         PT Treatment Interventions      PT Goals (Current goals can be found in the Care Plan section)  Acute Rehab PT Goals PT Goal Formulation: All assessment and education complete, DC therapy    Frequency     Barriers to discharge        Co-evaluation               AM-PAC PT "6 Clicks" Daily Activity  Outcome Measure Difficulty turning over in bed (including adjusting bedclothes, sheets and blankets)?: None Difficulty moving from lying on back to sitting on the side of the bed? : None Difficulty sitting down on and standing up from a chair with arms (e.g., wheelchair, bedside commode, etc,.)?: None Help needed moving to and from a bed to chair (including a wheelchair)?: None Help needed walking in hospital room?: None Help needed climbing 3-5 steps with a railing? : None 6 Click Score: 24    End of Session Equipment Utilized During Treatment: Gait belt Activity Tolerance: Patient tolerated treatment well Patient left: in bed;with call bell/phone within reach;with family/visitor present   PT Visit Diagnosis: Hemiplegia and hemiparesis Hemiplegia - Right/Left: Left Hemiplegia - dominant/non-dominant: Non-dominant Hemiplegia - caused by: Cerebral infarction    Time: 1350-1410 PT Time Calculation (min) (ACUTE ONLY): 20 min   Charges:   PT Evaluation $PT Eval Low Complexity: 1 Procedure PT Treatments $Neuromuscular Re-education: 8-22 mins   PT G Codes:   PT G-Codes **NOT FOR INPATIENT CLASS** Functional Assessment Tool Used: AM-PAC 6 Clicks Basic Mobility;Clinical judgement Functional Limitation: Mobility: Walking and moving around Mobility: Walking and Moving Around Current  Status ([id number](G8978): At least 1 percent but less than 20 percent impaired, limited or restricted Mobility: Walking and Moving Around Goal Status [phone](G8979): At least 1 percent but less than 20 percent impaired, limited or restricted Mobility: Walking and Moving Around Discharge Status [phone](G8980): At least 1 percent but less than 20 percent impaired, limited or restricted    Mala Gibbard H. Manson PasseyBrown, PT, DPT, NCS 11/10/16, 10:06 PM [phone]
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
## 2016-11-10 NOTE — Discharge Instructions (Signed)
Need to follow with PMD regularly.

## 2016-11-10 NOTE — Discharge Summary (Signed)
[facility]   PATIENT NAME: Gordon Freeman    MR#:  [id number]  DATE OF BIRTH:  09-10-1970  DATE OF ADMISSION:  11/09/2016 ADMITTING PHYSICIAN: Tonye Royalty, DO  DATE OF DISCHARGE: 11/10/2016  PRIMARY CARE PHYSICIAN: Patient, No Pcp Per    ADMISSION DIAGNOSIS:  Left-sided weakness [R53.1] Cerebrovascular accident (CVA), unspecified mechanism (HCC) [I63.9]  DISCHARGE DIAGNOSIS:  Active Problems:   CVA (cerebral vascular accident) (HCC)   SECONDARY DIAGNOSIS:   Past Medical History:  Diagnosis Date  . Hypertension     HOSPITAL COURSE:   Acute stroke- Right internal capsule.   Carotid doppler, MRI brain, Echo done.   Seen by neurologist and PT.   Lipid panel and Hba1c checked.    Ordered labs for hyper coagulable state.-, to be followed by PMD as he have family history of young age stroke.    Give ASA, Atorvastatin, Amlodipine, Nicotine patch.    Find a PMD and follow regularly with him.  Htn, Hyperlipidemia   As above.  Smoking   Councelled for 4 min to stop smoking, given nicotine patch on d/c.  DISCHARGE CONDITIONS:   Stable.  CONSULTS OBTAINED:  Treatment Team:  Thana Farr, MD  DRUG ALLERGIES:  No Known Allergies  DISCHARGE MEDICATIONS:   Current Discharge Medication List    START taking these medications   Details  amLODipine (NORVASC) 10 MG tablet Take 1 tablet (10 mg total) by mouth daily. Qty: 30 tablet, Refills: 2    aspirin EC 81 MG tablet Take 1 tablet (81 mg total) by mouth daily. Qty: 30 tablet, Refills: 2    atorvastatin (LIPITOR) 40 MG tablet Take 1 tablet (40 mg total) by mouth daily at 6 PM. Qty: 30 tablet, Refills: 2    nicotine (NICODERM CQ - DOSED IN MG/24 HOURS) 21 mg/24hr patch Place 1 patch (21 mg total) onto the skin daily. Qty: 28 patch, Refills: 0         DISCHARGE INSTRUCTIONS:    Follow with PMD.  If you experience worsening of your admission symptoms,  develop shortness of breath, life threatening emergency, suicidal or homicidal thoughts you must seek medical attention immediately by calling 911 or calling your MD immediately  if symptoms less severe.  You Must read complete instructions/literature along with all the possible adverse reactions/side effects for all the Medicines you take and that have been prescribed to you. Take any new Medicines after you have completely understood and accept all the possible adverse reactions/side effects.   Please note  You were cared for by a hospitalist during your hospital stay. If you have any questions about your discharge medications or the care you received while you were in the hospital after you are discharged, you can call the unit and asked to speak with the hospitalist on call if the hospitalist that took care of you is not available. Once you are discharged, your primary care physician will handle any further medical issues. Please note that NO REFILLS for any discharge medications will be authorized once you are discharged, as it is imperative that you return to your primary care physician (or establish a relationship with a primary care physician if you do not have one) for your aftercare needs so that they can reassess your need for medications and monitor your lab values.    Today   CHIEF COMPLAINT:   Chief Complaint  Patient presents with  . Chest Pain  . stroke symptoms  HISTORY OF PRESENT ILLNESS:  Gordon Freeman  is a 46 y.o. male with a known history of HTN was in a usual state of health until more than one day ago when he developed chest pain, facial droop and left arm weakness, couldn't drive his car.  Noted slurring of his speech and speech difficulty. Patient denies lower extremity weakness but his family indicates that he stumbled into the emergency department because of left lower extremity weakness. Patient believes his symptoms are improving. Family agrees that speech is  starting to clear up.  Otherwise there has been no change in status. Patient has been taking medication as prescribed and there has been no recent change in medication or diet.  There has been no recent illness, travel or sick contacts.    Patient denies fevers/chills, weakness, dizziness, chest pain, shortness of breath, N/V/C/D, abdominal pain, dysuria/frequency, changes in mental status.    VITAL SIGNS:  Blood pressure (!) 149/60, pulse 69, temperature 98.4 F (36.9 C), temperature source Oral, resp. rate 18, height 6\' 1"  (1.854 m), weight (!) 147.7 kg (325 lb 9.6 oz), SpO2 98 %.  I/O:   Intake/Output Summary (Last 24 hours) at 11/10/16 1459 Last data filed at 11/10/16 1330  Gross per 24 hour  Intake            952.5 ml  Output              350 ml  Net            602.5 ml    PHYSICAL EXAMINATION:  GENERAL:  46 y.o.-year-old patient lying in the bed with no acute distress.  EYES: Pupils equal, round, reactive to light and accommodation. No scleral icterus. Extraocular muscles intact.  HEENT: Head atraumatic, normocephalic. Oropharynx and nasopharynx clear.  NECK:  Supple, no jugular venous distention. No thyroid enlargement, no tenderness.  LUNGS: Normal breath sounds bilaterally, no wheezing, rales,rhonchi or crepitation. No use of accessory muscles of respiration.  CARDIOVASCULAR: S1, S2 normal. No murmurs, rubs, or gallops.  ABDOMEN: Soft, non-tender, non-distended. Bowel sounds present. No organomegaly or mass.  EXTREMITIES: No pedal edema, cyanosis, or clubbing.  NEUROLOGIC: Cranial nerves II through XII are intact. Muscle strength 5/5 in all extremities except 4/5 in left upper limb.. Sensation intact. Gait not checked.  PSYCHIATRIC: The patient is alert and oriented x 3.  SKIN: No obvious rash, lesion, or ulcer.   DATA REVIEW:   CBC  Recent Labs Lab 11/09/16 1541  WBC 12.3*  HGB 13.3  HCT 40.9  PLT 252    Chemistries   Recent Labs Lab 11/09/16 1541  NA  140  K 3.4*  CL 107  CO2 26  GLUCOSE 115*  BUN 16  CREATININE 1.10  CALCIUM 9.4  AST 24  ALT 19  ALKPHOS 70  BILITOT 0.5    Cardiac Enzymes  Recent Labs Lab 11/10/16 1108  TROPONINI <0.03    Microbiology Results  No results found for this or any previous visit.  RADIOLOGY:  Ct Head Wo Contrast  Result Date: 11/09/2016 CLINICAL DATA:  Chest pain and LEFT facial droop beginning yesterday, LEFT arm weakness. History of hypertension. EXAM: CT HEAD WITHOUT CONTRAST TECHNIQUE: Contiguous axial images were obtained from the base of the skull through the vertex without intravenous contrast. COMPARISON:  None. FINDINGS: BRAIN: No intraparenchymal hemorrhage, mass effect nor midline shift. The ventricles and sulci are normal. No acute large vascular territory infarcts. No abnormal extra-axial fluid collections. Basal cisterns are patent. VASCULAR: Trace  calcific atherosclerosis carotid siphon. SKULL/SOFT TISSUES: No skull fracture. No significant soft tissue swelling. ORBITS/SINUSES: The included ocular globes and orbital contents are normal.The mastoid aircells and included paranasal sinuses are well-aerated. OTHER: None. IMPRESSION: Negative noncontrast CT HEAD. Electronically Signed   By: Awilda Metro M.D.   On: 11/09/2016 15:58   Mr Maxine Glenn Head Wo Contrast  Result Date: 11/10/2016 CLINICAL DATA:  Chest pain and LEFT facial droop beginning yesterday, LEFT arm weakness. History of hypertension. EXAM: MRI HEAD WITHOUT CONTRAST MRA HEAD WITHOUT CONTRAST TECHNIQUE: Multiplanar, multiecho pulse sequences of the brain and surrounding structures were obtained without intravenous contrast. Angiographic images of the head were obtained using MRA technique without contrast. COMPARISON:  CT HEAD Nov 09, 2016 at 1550 hours FINDINGS: MRI HEAD FINDINGS BRAIN: 8 mm ovoid reduced diffusion and bright T2 signal RIGHT posterior limb of the internal capsule with low ADC values. Ventricles and sulci are  normal for patient's age. No midline shift, mass effect or masses. A few punctate supratentorial white matter FLAIR T2 hyperintensities. No susceptibility artifact to suggest hemorrhage. The ventricles and sulci are normal for patient's age. No suspicious parenchymal signal, masses or mass effect. No abnormal extra-axial fluid collections. VASCULAR: Normal major intracranial vascular flow voids present at skull base. SKULL AND UPPER CERVICAL SPINE: No abnormal sellar expansion. No suspicious calvarial bone marrow signal. Craniocervical junction maintained. SINUSES/ORBITS: The mastoid air-cells and included paranasal sinuses are well-aerated. The included ocular globes and orbital contents are non-suspicious. OTHER: None. MRA HEAD FINDINGS- Mild motion degraded examination. ANTERIOR CIRCULATION: Normal flow related enhancement of the included cervical, petrous, cavernous and supraclinoid internal carotid arteries. Patent anterior communicating artery. Flow related enhancement of the anterior and middle cerebral arteries, including distal segments, moderate luminal regularity compatible with atherosclerosis. No large vessel occlusion, high-grade stenosis, aneurysm. POSTERIOR CIRCULATION: LEFT vertebral artery is dominant. RIGHT vertebral artery terminates in the posterior inferior cerebellar artery. Basilar artery is patent, with normal flow related enhancement of the main branch vessels. Flow related enhancement of the posterior cerebral arteries, moderate luminal irregularity RIGHT posterior cerebral artery compatible with atherosclerosis. Bilateral posterior communicating arteries present. No large vessel occlusion, high-grade stenosis, aneurysm. ANATOMIC VARIANTS: None. 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 Mild chronic small vessel ischemic disease. MRA HEAD: No emergent large vessel occlusion. Moderate intracranial atherosclerosis. Acute findings discussed with and reconfirmed by  [facility] GOODMAN on 11/09/2016 at 6:40 pm. Electronically Signed   By: Awilda Metro M.D.   On: 11/09/2016 19:10   Mr Angiogram Head W Contrast  Result Date: 11/09/2016 CLINICAL DATA:  Chest pain and LEFT facial droop beginning yesterday, LEFT arm weakness. History of hypertension. EXAM: MRI HEAD WITHOUT CONTRAST MRA HEAD WITHOUT CONTRAST TECHNIQUE: Multiplanar, multiecho pulse sequences of the brain and surrounding structures were obtained without intravenous contrast. Angiographic images of the head were obtained using MRA technique without contrast. COMPARISON:  CT HEAD Nov 09, 2016 at 1550 hours FINDINGS: MRI HEAD FINDINGS BRAIN: 8 mm ovoid reduced diffusion and bright T2 signal RIGHT posterior limb of the internal capsule with low ADC values. Ventricles and sulci are normal for patient's age. No midline shift, mass effect or masses. A few punctate supratentorial white matter FLAIR T2 hyperintensities. No susceptibility artifact to suggest hemorrhage. The ventricles and sulci are normal for patient's age. No suspicious parenchymal signal, masses or mass effect. No abnormal extra-axial fluid collections. VASCULAR: Normal major intracranial vascular flow voids present at skull base. SKULL AND UPPER CERVICAL SPINE: No abnormal sellar  expansion. No suspicious calvarial bone marrow signal. Craniocervical junction maintained. SINUSES/ORBITS: The mastoid air-cells and included paranasal sinuses are well-aerated. The included ocular globes and orbital contents are non-suspicious. OTHER: None. MRA HEAD FINDINGS- Mild motion degraded examination. ANTERIOR CIRCULATION: Normal flow related enhancement of the included cervical, petrous, cavernous and supraclinoid internal carotid arteries. Patent anterior communicating artery. Flow related enhancement of the anterior and middle cerebral arteries, including distal segments, moderate luminal regularity compatible with atherosclerosis. No large vessel occlusion,  high-grade stenosis, aneurysm. POSTERIOR CIRCULATION: LEFT vertebral artery is dominant. RIGHT vertebral artery terminates in the posterior inferior cerebellar artery. Basilar artery is patent, with normal flow related enhancement of the main branch vessels. Flow related enhancement of the posterior cerebral arteries, moderate luminal irregularity RIGHT posterior cerebral artery compatible with atherosclerosis. Bilateral posterior communicating arteries present. No large vessel occlusion, high-grade stenosis, aneurysm. ANATOMIC VARIANTS: None. 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 Mild chronic small vessel ischemic disease. MRA HEAD: No emergent large vessel occlusion. Moderate intracranial atherosclerosis. Acute findings discussed with and reconfirmed by [facility] GOODMAN on 11/09/2016 at 6:40 pm. Electronically Signed   By: Awilda Metro M.D.   On: 11/09/2016 19:10   Mr Brain Wo Contrast  Result Date: 11/09/2016 CLINICAL DATA:  Chest pain and LEFT facial droop beginning yesterday, LEFT arm weakness. History of hypertension. EXAM: MRI HEAD WITHOUT CONTRAST MRA HEAD WITHOUT CONTRAST TECHNIQUE: Multiplanar, multiecho pulse sequences of the brain and surrounding structures were obtained without intravenous contrast. Angiographic images of the head were obtained using MRA technique without contrast. COMPARISON:  CT HEAD Nov 09, 2016 at 1550 hours FINDINGS: MRI HEAD FINDINGS BRAIN: 8 mm ovoid reduced diffusion and bright T2 signal RIGHT posterior limb of the internal capsule with low ADC values. Ventricles and sulci are normal for patient's age. No midline shift, mass effect or masses. A few punctate supratentorial white matter FLAIR T2 hyperintensities. No susceptibility artifact to suggest hemorrhage. The ventricles and sulci are normal for patient's age. No suspicious parenchymal signal, masses or mass effect. No abnormal extra-axial fluid collections. VASCULAR: Normal major  intracranial vascular flow voids present at skull base. SKULL AND UPPER CERVICAL SPINE: No abnormal sellar expansion. No suspicious calvarial bone marrow signal. Craniocervical junction maintained. SINUSES/ORBITS: The mastoid air-cells and included paranasal sinuses are well-aerated. The included ocular globes and orbital contents are non-suspicious. OTHER: None. MRA HEAD FINDINGS- Mild motion degraded examination. ANTERIOR CIRCULATION: Normal flow related enhancement of the included cervical, petrous, cavernous and supraclinoid internal carotid arteries. Patent anterior communicating artery. Flow related enhancement of the anterior and middle cerebral arteries, including distal segments, moderate luminal regularity compatible with atherosclerosis. No large vessel occlusion, high-grade stenosis, aneurysm. POSTERIOR CIRCULATION: LEFT vertebral artery is dominant. RIGHT vertebral artery terminates in the posterior inferior cerebellar artery. Basilar artery is patent, with normal flow related enhancement of the main branch vessels. Flow related enhancement of the posterior cerebral arteries, moderate luminal irregularity RIGHT posterior cerebral artery compatible with atherosclerosis. Bilateral posterior communicating arteries present. No large vessel occlusion, high-grade stenosis, aneurysm. ANATOMIC VARIANTS: None. 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 Mild chronic small vessel ischemic disease. MRA HEAD: No emergent large vessel occlusion. Moderate intracranial atherosclerosis. Acute findings discussed with and reconfirmed by [facility] GOODMAN on 11/09/2016 at 6:40 pm. Electronically Signed   By: Awilda Metro M.D.   On: 11/09/2016 19:10   US Carotid Bilateral (at [facility] Only)  Result Date: 11/10/2016 CLINICAL DATA:  Stroke, hypertension, syncope, tobacco use  EXAM: BILATERAL CAROTID DUPLEX ULTRASOUND TECHNIQUE: Wallace Cullens scale imaging, color Doppler and duplex ultrasound were  performed of bilateral carotid and vertebral arteries in the neck. COMPARISON:  11/09/2016 FINDINGS: Criteria: Quantification of carotid stenosis is based on velocity parameters that correlate the residual internal carotid diameter with NASCET-based stenosis levels, using the diameter of the distal internal carotid lumen as the denominator for stenosis measurement. The following velocity measurements were obtained: RIGHT ICA:  93/21 cm/sec CCA:  114/22 cm/sec SYSTOLIC ICA/CCA RATIO:  0.8 DIASTOLIC ICA/CCA RATIO:  1.0 ECA:  162 cm/sec LEFT ICA:  103/20 cm/sec CCA:  121/24 cm/sec SYSTOLIC ICA/CCA RATIO:  0.9 DIASTOLIC ICA/CCA RATIO:  0.8 ECA:  146 cm/sec RIGHT CAROTID ARTERY: No significant atherosclerotic plaque formation. No hemodynamically significant right ICA stenosis, velocity elevation, or turbulent flow. Degree of narrowing less than 50%. RIGHT VERTEBRAL ARTERY:  Antegrade LEFT CAROTID ARTERY: No significant atherosclerosis. No hemodynamically significant left ICA stenosis, velocity elevation, or turbulent flow. Degree of narrowing also less than 50% . LEFT VERTEBRAL ARTERY:  Antegrade IMPRESSION: No significant carotid atherosclerosis. No hemodynamically significant ICA stenosis. Degree of narrowing less than 50% bilaterally. Patent antegrade vertebral flow bilaterally Electronically Signed   By: Judie Petit.  Shick M.D.   On: 11/10/2016 09:32    EKG:   Orders placed or performed during the hospital encounter of 11/09/16  . ED EKG  . ED EKG  . EKG 12-Lead  . EKG 12-Lead      Management plans discussed with the patient, family and they are in agreement.  CODE STATUS:     Code Status Orders        Start     Ordered   11/09/16 2301  Full code  Continuous     11/09/16 2300    Code Status History    Date Active Date Inactive Code Status Order ID Comments User Context   This patient has a current code status but no historical code status.      TOTAL TIME TAKING CARE OF THIS PATIENT: 35  minutes.    Altamese Dilling M.D on 11/10/2016 at 2:59 PM  Between 7am to 6pm - Pager - [phone]  After 6pm go to [email or web address] - password [email or web address]  [facility]  Office  [phone]  CC: Primary care physician; Patient, No Pcp Per   Note: This dictation was prepared with Dragon dictation along with smaller phrase technology. Any transcriptional errors that result from this process are unintentional.

## 2016-11-10 NOTE — Clinical Social Work Note (Signed)
CSW consulted for Medication needs. CSW updated RNCM who will follow for assistance. CSW is signing off as no further needs identified.   Dede QuerySarah Caillou Minus, MSW, LCSW Clinical Social Worker  [phone]

## 2016-11-10 NOTE — Consult Note (Addendum)
Referring Physician: Anselm Jungling    Chief Complaint: Left sided weakness  HPI: Gordon Freeman is an 46 y.o. male with a history of HTN, noncompliant with medications who reports that he went to bed at baseline on Sunday  On awakening Monday he noted left sided weakness but was able to go to work and perform his duties.  Did notice that he had more difficulty driving with the left hand.  On Tuesday noted some slurred speech and his co-workers noted a facial droop.  Patient was encouraged to seek medical attention at that time.  Initial NIHSS of 2.   Date last known well: Date: 11/07/2016 Time last known well: Time: 19:00 tPA Given: No: Outside time window  Past Medical History:  Diagnosis Date  . Hypertension     History reviewed. No pertinent surgical history.  Family history: Mother and father with a history of stroke.  Sister with a history of multiple miscarriages  Social History:  reports that he has been smoking Cigarettes.  He has been smoking about 1.00 pack per day. He has never used smokeless tobacco. He reports that he uses drugs, including Marijuana. He reports that he does not drink alcohol.  Allergies: No Known Allergies  Medications:  I have reviewed the patient's current medications. Prior to Admission:  No prescriptions prior to admission.   Scheduled: . aspirin  300 mg Rectal Daily   Or  . aspirin  325 mg Oral Daily  . atorvastatin  40 mg Oral q1800  . enoxaparin (LOVENOX) injection  40 mg Subcutaneous BID  . nicotine  21 mg Transdermal Daily    ROS: History obtained from the patient  General ROS: negative for - chills, fatigue, fever, night sweats, weight gain or weight loss Psychological ROS: negative for - behavioral disorder, hallucinations, memory difficulties, mood swings or suicidal ideation Ophthalmic ROS: negative for - blurry vision, double vision, eye pain or loss of vision ENT ROS: negative for - epistaxis, nasal discharge, oral lesions, sore throat,  tinnitus or vertigo Allergy and Immunology ROS: negative for - hives or itchy/watery eyes Hematological and Lymphatic ROS: negative for - bleeding problems, bruising or swollen lymph nodes Endocrine ROS: negative for - galactorrhea, hair pattern changes, polydipsia/polyuria or temperature intolerance Respiratory ROS: negative for - cough, hemoptysis, shortness of breath or wheezing Cardiovascular ROS: negative for - chest pain, dyspnea on exertion, edema or irregular heartbeat Gastrointestinal ROS: negative for - abdominal pain, diarrhea, hematemesis, nausea/vomiting or stool incontinence Genito-Urinary ROS: negative for - dysuria, hematuria, incontinence or urinary frequency/urgency Musculoskeletal ROS: negative for - joint swelling or muscular weakness Neurological ROS: as noted in HPI Dermatological ROS: negative for rash and skin lesion changes  Physical Examination: Blood pressure (!) 169/77, pulse 71, temperature 98.3 F (36.8 C), temperature source Oral, resp. rate 18, height 6' 1"  (1.854 m), weight (!) 147.7 kg (325 lb 9.6 oz), SpO2 100 %.  Gen: NAD, obese HEENT-  Normocephalic, no lesions, without obvious abnormality.  Normal external eye and conjunctiva.  Normal TM's bilaterally.  Normal auditory canals and external ears. Normal external nose, mucus membranes and septum.  Normal pharynx. Cardiovascular- S1, S2 normal, pulses palpable throughout   Lungs- chest clear, no wheezing, rales, normal symmetric air entry Abdomen- soft, non-tender; bowel sounds normal; no masses,  no organomegaly Extremities- no edema Lymph-no adenopathy palpable Musculoskeletal-no joint tenderness, deformity or swelling Skin-warm and dry, no hyperpigmentation, vitiligo, or suspicious lesions  Neurological Examination   Mental Status: Alert, oriented, thought content appropriate.  Speech fluent  without evidence of aphasia.  Able to follow 3 step commands without difficulty. Cranial Nerves: II: Discs  flat bilaterally; Visual fields grossly normal, pupils equal, round, reactive to light and accommodation III,IV, VI: ptosis not present, extra-ocular motions intact bilaterally V,VII: left facial droop, facial light touch sensation normal bilaterally VIII: hearing normal bilaterally IX,X: gag reflex present XI: bilateral shoulder shrug XII: midline tongue extension Motor: Right : Upper extremity   5/5    Left:     Upper extremity   5-/5  Lower extremity   5/5     Lower extremity   5-/5 Tone and bulk:normal tone throughout; no atrophy noted Sensory: Pinprick and light touch intact throughout, bilaterally Deep Tendon Reflexes: 1+ and symmetric with absent AJ's bilaterally Plantars: Right: downgoing   Left: upgoing Cerebellar: Normal finger-to-nose on the right with mild dysmetria on the left.  Normal heel-to-shin testing bilaterally Gait: not tested due to safety concerns    Laboratory Studies:  Basic Metabolic Panel:  Recent Labs Lab 11/09/16 1541  NA 140  K 3.4*  CL 107  CO2 26  GLUCOSE 115*  BUN 16  CREATININE 1.10  CALCIUM 9.4    Liver Function Tests:  Recent Labs Lab 11/09/16 1541  AST 24  ALT 19  ALKPHOS 70  BILITOT 0.5  PROT 7.8  ALBUMIN 4.5   No results for input(s): LIPASE, AMYLASE in the last 168 hours. No results for input(s): AMMONIA in the last 168 hours.  CBC:  Recent Labs Lab 11/09/16 1541  WBC 12.3*  NEUTROABS 8.1*  HGB 13.3  HCT 40.9  MCV 79.2*  PLT 252    Cardiac Enzymes:  Recent Labs Lab 11/09/16 1541 11/10/16 0003 11/10/16 0542  TROPONINI <0.03 <0.03 <0.03    BNP: Invalid input(s): POCBNP  CBG: No results for input(s): GLUCAP in the last 168 hours.  Microbiology: No results found for this or any previous visit.  Coagulation Studies:  Recent Labs  11/09/16 1541  LABPROT 13.7  INR 1.05    Urinalysis:  Recent Labs Lab 11/10/16 0115  COLORURINE YELLOW*  LABSPEC 1.015  PHURINE 6.0  GLUCOSEU NEGATIVE   HGBUR NEGATIVE  BILIRUBINUR NEGATIVE  KETONESUR NEGATIVE  PROTEINUR NEGATIVE  NITRITE NEGATIVE  LEUKOCYTESUR NEGATIVE    Lipid Panel:    Component Value Date/Time   CHOL 190 11/10/2016 0542   TRIG 97 11/10/2016 0542   HDL 23 (L) 11/10/2016 0542   CHOLHDL 8.3 11/10/2016 0542   VLDL 19 11/10/2016 0542   LDLCALC 148 (H) 11/10/2016 0542    HgbA1C: No results found for: HGBA1C  Urine Drug Screen:     Component Value Date/Time   LABOPIA NONE DETECTED 11/10/2016 0115   COCAINSCRNUR NONE DETECTED 11/10/2016 0115   LABBENZ NONE DETECTED 11/10/2016 0115   AMPHETMU NONE DETECTED 11/10/2016 0115   THCU POSITIVE (A) 11/10/2016 0115   LABBARB NONE DETECTED 11/10/2016 0115    Alcohol Level: No results for input(s): ETH in the last 168 hours.  Other results: EKG: normal sinus rhythm at 86 bpm.  Imaging: Ct Head Wo Contrast  Result Date: 11/09/2016 CLINICAL DATA:  Chest pain and LEFT facial droop beginning yesterday, LEFT arm weakness. History of hypertension. EXAM: CT HEAD WITHOUT CONTRAST TECHNIQUE: Contiguous axial images were obtained from the base of the skull through the vertex without intravenous contrast. COMPARISON:  None. FINDINGS: BRAIN: No intraparenchymal hemorrhage, mass effect nor midline shift. The ventricles and sulci are normal. No acute large vascular territory infarcts. No abnormal extra-axial fluid collections.  Basal cisterns are patent. VASCULAR: Trace calcific atherosclerosis carotid siphon. SKULL/SOFT TISSUES: No skull fracture. No significant soft tissue swelling. ORBITS/SINUSES: The included ocular globes and orbital contents are normal.The mastoid aircells and included paranasal sinuses are well-aerated. OTHER: None. IMPRESSION: Negative noncontrast CT HEAD. Electronically Signed   By: Elon Alas M.D.   On: 11/09/2016 15:58   Mr Angiogram Head W Contrast  Result Date: 11/09/2016 CLINICAL DATA:  Chest pain and LEFT facial droop beginning yesterday, LEFT arm  weakness. History of hypertension. EXAM: MRI HEAD WITHOUT CONTRAST MRA HEAD WITHOUT CONTRAST TECHNIQUE: Multiplanar, multiecho pulse sequences of the brain and surrounding structures were obtained without intravenous contrast. Angiographic images of the head were obtained using MRA technique without contrast. COMPARISON:  CT HEAD Nov 09, 2016 at 1550 hours FINDINGS: MRI HEAD FINDINGS BRAIN: 8 mm ovoid reduced diffusion and bright T2 signal RIGHT posterior limb of the internal capsule with low ADC values. Ventricles and sulci are normal for patient's age. No midline shift, mass effect or masses. A few punctate supratentorial white matter FLAIR T2 hyperintensities. No susceptibility artifact to suggest hemorrhage. The ventricles and sulci are normal for patient's age. No suspicious parenchymal signal, masses or mass effect. No abnormal extra-axial fluid collections. VASCULAR: Normal major intracranial vascular flow voids present at skull base. SKULL AND UPPER CERVICAL SPINE: No abnormal sellar expansion. No suspicious calvarial bone marrow signal. Craniocervical junction maintained. SINUSES/ORBITS: The mastoid air-cells and included paranasal sinuses are well-aerated. The included ocular globes and orbital contents are non-suspicious. OTHER: None. MRA HEAD FINDINGS- Mild motion degraded examination. ANTERIOR CIRCULATION: Normal flow related enhancement of the included cervical, petrous, cavernous and supraclinoid internal carotid arteries. Patent anterior communicating artery. Flow related enhancement of the anterior and middle cerebral arteries, including distal segments, moderate luminal regularity compatible with atherosclerosis. No large vessel occlusion, high-grade stenosis, aneurysm. POSTERIOR CIRCULATION: LEFT vertebral artery is dominant. RIGHT vertebral artery terminates in the posterior inferior cerebellar artery. Basilar artery is patent, with normal flow related enhancement of the main branch vessels. Flow  related enhancement of the posterior cerebral arteries, moderate luminal irregularity RIGHT posterior cerebral artery compatible with atherosclerosis. Bilateral posterior communicating arteries present. No large vessel occlusion, high-grade stenosis, aneurysm. ANATOMIC VARIANTS: None. 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 Mild chronic small vessel ischemic disease. MRA HEAD: No emergent large vessel occlusion. Moderate intracranial atherosclerosis. Acute findings discussed with and reconfirmed by [facility] on 11/09/2016 at 6:40 pm. Electronically Signed   By: Elon Alas M.D.   On: 11/09/2016 19:10   Mr Brain Wo Contrast  Result Date: 11/09/2016 CLINICAL DATA:  Chest pain and LEFT facial droop beginning yesterday, LEFT arm weakness. History of hypertension. EXAM: MRI HEAD WITHOUT CONTRAST MRA HEAD WITHOUT CONTRAST TECHNIQUE: Multiplanar, multiecho pulse sequences of the brain and surrounding structures were obtained without intravenous contrast. Angiographic images of the head were obtained using MRA technique without contrast. COMPARISON:  CT HEAD Nov 09, 2016 at 1550 hours FINDINGS: MRI HEAD FINDINGS BRAIN: 8 mm ovoid reduced diffusion and bright T2 signal RIGHT posterior limb of the internal capsule with low ADC values. Ventricles and sulci are normal for patient's age. No midline shift, mass effect or masses. A few punctate supratentorial white matter FLAIR T2 hyperintensities. No susceptibility artifact to suggest hemorrhage. The ventricles and sulci are normal for patient's age. No suspicious parenchymal signal, masses or mass effect. No abnormal extra-axial fluid collections. VASCULAR: Normal major intracranial vascular flow voids present at skull base. SKULL AND UPPER  CERVICAL SPINE: No abnormal sellar expansion. No suspicious calvarial bone marrow signal. Craniocervical junction maintained. SINUSES/ORBITS: The mastoid air-cells and included paranasal sinuses are  well-aerated. The included ocular globes and orbital contents are non-suspicious. OTHER: None. MRA HEAD FINDINGS- Mild motion degraded examination. ANTERIOR CIRCULATION: Normal flow related enhancement of the included cervical, petrous, cavernous and supraclinoid internal carotid arteries. Patent anterior communicating artery. Flow related enhancement of the anterior and middle cerebral arteries, including distal segments, moderate luminal regularity compatible with atherosclerosis. No large vessel occlusion, high-grade stenosis, aneurysm. POSTERIOR CIRCULATION: LEFT vertebral artery is dominant. RIGHT vertebral artery terminates in the posterior inferior cerebellar artery. Basilar artery is patent, with normal flow related enhancement of the main branch vessels. Flow related enhancement of the posterior cerebral arteries, moderate luminal irregularity RIGHT posterior cerebral artery compatible with atherosclerosis. Bilateral posterior communicating arteries present. No large vessel occlusion, high-grade stenosis, aneurysm. ANATOMIC VARIANTS: None. 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 Mild chronic small vessel ischemic disease. MRA HEAD: No emergent large vessel occlusion. Moderate intracranial atherosclerosis. Acute findings discussed with and reconfirmed by [facility]MAN on 11/09/2016 at 6:40 pm. Electronically Signed   By: Elon Alas M.D.   On: 11/09/2016 19:10   US Carotid Bilateral (at [facility] Only)  Result Date: 11/10/2016 CLINICAL DATA:  Stroke, hypertension, syncope, tobacco use EXAM: BILATERAL CAROTID DUPLEX ULTRASOUND TECHNIQUE: Pearline Cables scale imaging, color Doppler and duplex ultrasound were performed of bilateral carotid and vertebral arteries in the neck. COMPARISON:  11/09/2016 FINDINGS: Criteria: Quantification of carotid stenosis is based on velocity parameters that correlate the residual internal carotid diameter with NASCET-based stenosis levels, using the  diameter of the distal internal carotid lumen as the denominator for stenosis measurement. The following velocity measurements were obtained: RIGHT ICA:  93/21 cm/sec CCA:  706/23 cm/sec SYSTOLIC ICA/CCA RATIO:  0.8 DIASTOLIC ICA/CCA RATIO:  1.0 ECA:  162 cm/sec LEFT ICA:  103/20 cm/sec CCA:  762/83 cm/sec SYSTOLIC ICA/CCA RATIO:  0.9 DIASTOLIC ICA/CCA RATIO:  0.8 ECA:  146 cm/sec RIGHT CAROTID ARTERY: No significant atherosclerotic plaque formation. No hemodynamically significant right ICA stenosis, velocity elevation, or turbulent flow. Degree of narrowing less than 50%. RIGHT VERTEBRAL ARTERY:  Antegrade LEFT CAROTID ARTERY: No significant atherosclerosis. No hemodynamically significant left ICA stenosis, velocity elevation, or turbulent flow. Degree of narrowing also less than 50% . LEFT VERTEBRAL ARTERY:  Antegrade IMPRESSION: No significant carotid atherosclerosis. No hemodynamically significant ICA stenosis. Degree of narrowing less than 50% bilaterally. Patent antegrade vertebral flow bilaterally Electronically Signed   By: Jerilynn Mages.  Shick M.D.   On: 11/10/2016 09:32    Assessment: 46 y.o. male with a history of HTN, noncompliant with medications, presenting with left sided weakness.  MRI of the brain reviewed and shows a right IC infarct.  Patient on no antiplatelet therapy at home.   Carotid dopplers show no evidence of hemodynamically significant stenosis.  Echocardiogram pending.  A1c pending, LDL 148.  Although infarct likely secondary to small vessel disease, with young age and strong family history further work up recommended.   Stroke Risk Factors - hypertension and smoking  Plan: 1. HgbA1c, fasting lipid panel, ESR, ATIII, lupus anticoagulant, anticardiolipin antibody, protein S, protein C, homocysteine, factor V 2. Smoking cessation counseling 3. PT consult, OT consult, Speech consult 4. Echocardiogram pending 5. Prophylactic therapy-Antiplatelet med: Aspirin - dose 363m daily 6. NPO until  RN stroke swallow screen 7. Telemetry monitoring 8. Frequent neuro checks 9. Medication compliance stressed 10. Statin for lipid management  with target LDL<70.    Alexis Goodell, MD Neurology [phone] 11/10/2016, 10:34 AM

## 2016-11-11 LAB — HOMOCYSTEINE: Homocysteine: 12.6 umol/L (ref 0.0–15.0)

## 2016-11-11 LAB — LUPUS ANTICOAGULANT PANEL
DRVVT: 35.3 s (ref 0.0–47.0)
PTT Lupus Anticoagulant: 34.5 s (ref 0.0–51.9)

## 2016-11-11 LAB — HEMOGLOBIN A1C
Hgb A1c MFr Bld: 5.6 % (ref 4.8–5.6)
Mean Plasma Glucose: 114 mg/dL

## 2016-11-11 LAB — CARDIOLIPIN ANTIBODIES, IGG, IGM, IGA: Anticardiolipin IgA: <9 APL U/mL (ref 0–11)

## 2016-11-11 LAB — PROTEIN S, TOTAL: Protein S Ag, Total: 76 % (ref 60–150)

## 2016-11-11 LAB — HIV ANTIBODY (ROUTINE TESTING W REFLEX): HIV Screen 4th Generation wRfx: NONREACTIVE

## 2016-11-13 LAB — PROTEIN C, TOTAL: Protein C, Total: 68 % (ref 60–150)

## 2016-11-15 LAB — FACTOR 5 LEIDEN

## 2017-01-17 ENCOUNTER — Ambulatory Visit: Payer: Self-pay | Admitting: Pharmacy Technician

## 2017-01-17 DIAGNOSIS — Z79899 Other long term (current) drug therapy: Secondary | ICD-10-CM

## 2017-01-17 NOTE — Progress Notes (Signed)
Met with patient completed financial assistance application for Gordon Freeman due to recent hospital visit.  Patient agreed to be responsible for gathering financial information and forwarding to appropriate department in [facility].    Completed [facility] application and contract.  Patient agreed to all terms of the [facility] contract.  Patient approved to receive medication assistance through 2018, as long as eligibility criteria continues to be met.  Provided patient with Civil engineer, contracting based on his particular needs.    Referred patient to [facility].  [facility] [facility]

## 2017-03-16 ENCOUNTER — Emergency Department
Admission: EM | Admit: 2017-03-16 | Discharge: 2017-03-16 | Disposition: A | Discharge status: Home / Self Care | Payer: Self-pay | Attending: Emergency Medicine | Admitting: Emergency Medicine

## 2017-03-16 ENCOUNTER — Encounter: Payer: Self-pay | Admitting: *Deleted

## 2017-03-16 DIAGNOSIS — R519 Headache, unspecified: Secondary | ICD-10-CM

## 2017-03-16 DIAGNOSIS — Z8673 Personal history of transient ischemic attack (TIA), and cerebral infarction without residual deficits: Secondary | ICD-10-CM | POA: Insufficient documentation

## 2017-03-16 DIAGNOSIS — F17210 Nicotine dependence, cigarettes, uncomplicated: Secondary | ICD-10-CM | POA: Insufficient documentation

## 2017-03-16 DIAGNOSIS — Z79899 Other long term (current) drug therapy: Secondary | ICD-10-CM | POA: Insufficient documentation

## 2017-03-16 DIAGNOSIS — R51 Headache: Secondary | ICD-10-CM | POA: Insufficient documentation

## 2017-03-16 DIAGNOSIS — Z7982 Long term (current) use of aspirin: Secondary | ICD-10-CM | POA: Insufficient documentation

## 2017-03-16 DIAGNOSIS — I10 Essential (primary) hypertension: Secondary | ICD-10-CM | POA: Insufficient documentation

## 2017-03-16 DIAGNOSIS — Z760 Encounter for issue of repeat prescription: Secondary | ICD-10-CM | POA: Insufficient documentation

## 2017-03-16 HISTORY — DX: Cerebral infarction, unspecified: I63.9

## 2017-03-16 LAB — BASIC METABOLIC PANEL
Anion gap: 8 (ref 5–15)
BUN: 15 mg/dL (ref 6–20)
CHLORIDE: 105 mmol/L (ref 101–111)
CO2: 26 mmol/L (ref 22–32)
CREATININE: 1.17 mg/dL (ref 0.61–1.24)
Calcium: 9 mg/dL (ref 8.9–10.3)
GFR calc Af Amer: >60 mL/min (ref >60)
GFR calc non Af Amer: >60 mL/min (ref >60)
Glucose, Bld: 109 mg/dL — ABNORMAL HIGH (ref 65–99)
Potassium: 3.3 mmol/L — ABNORMAL LOW (ref 3.5–5.1)
Sodium: 139 mmol/L (ref 135–145)

## 2017-03-16 LAB — CBC
HCT: 37.8 % — ABNORMAL LOW (ref 40.0–52.0)
Hemoglobin: 12.6 g/dL — ABNORMAL LOW (ref 13.0–18.0)
MCH: 26.2 pg (ref 26.0–34.0)
MCHC: 33.3 g/dL (ref 32.0–36.0)
MCV: 78.8 fL — AB (ref 80.0–100.0)
PLATELETS: 260 10*3/uL (ref 150–440)
RBC: 4.8 MIL/uL (ref 4.40–5.90)
RDW: 16.8 % — AB (ref 11.5–14.5)
WBC: 13.5 10*3/uL — ABNORMAL HIGH (ref 3.8–10.6)

## 2017-03-16 MED ORDER — AMLODIPINE BESYLATE 10 MG PO TABS
10.0000 mg | ORAL_TABLET | Freq: Every day | ORAL | 0 refills | Status: DC
Start: 2017-03-16 — End: 2017-03-17

## 2017-03-16 MED ORDER — ATORVASTATIN CALCIUM 40 MG PO TABS: 40.0000 mg | ORAL_TABLET | Freq: Every day | ORAL | 0 refills | Status: DC

## 2017-03-16 MED ORDER — AMLODIPINE BESYLATE 5 MG PO TABS
10.0000 mg | ORAL_TABLET | Freq: Once | ORAL | Status: AC
  Administered 2017-03-16: 10 mg via ORAL
  Filled 2017-03-16: qty 2

## 2017-03-16 MED ORDER — ATORVASTATIN CALCIUM 20 MG PO TABS
40.0000 mg | ORAL_TABLET | Freq: Once | ORAL | Status: AC
  Administered 2017-03-16: 40 mg via ORAL
  Filled 2017-03-16: qty 2

## 2017-03-16 NOTE — ED Notes (Signed)
Pt's pain decreased from 10/10 to 5/10 during last hour. Pt and wife verbalized understanding of discharge instructions. Pt ambulatory upon discharge. Skin warm and dry. A&Ox4.

## 2017-03-16 NOTE — Discharge Instructions (Signed)
Please make an appointment to establish care with a primary care physician within the next week for reevaluation and return to the emergency department for any concerns.  It was a pleasure to take care of you today, and thank you for coming to our emergency department.  If you have any questions or concerns before leaving please ask the nurse to grab me and I'm more than happy to go through your aftercare instructions again.  If you were prescribed any opioid pain medication today such as Norco, Vicodin, Percocet, morphine, hydrocodone, or oxycodone please make sure you do not drive when you are taking this medication as it can alter your ability to drive safely.  If you have any concerns once you are home that you are not improving or are in fact getting worse before you can make it to your follow-up appointment, please do not hesitate to call [phone] and come back for further evaluation.  Merrily Brittle, MD  Results for orders placed or performed during the hospital encounter of 03/16/17  Basic metabolic panel  Result Value Ref Range   Sodium 139 135 - 145 mmol/L   Potassium 3.3 (L) 3.5 - 5.1 mmol/L   Chloride 105 101 - 111 mmol/L   CO2 26 22 - 32 mmol/L   Glucose, Bld 109 (H) 65 - 99 mg/dL   BUN 15 6 - 20 mg/dL   Creatinine, Ser 4.09 0.61 - 1.24 mg/dL   Calcium 9.0 8.9 - 81.1 mg/dL   GFR calc non Af Amer >60 >60 mL/min   GFR calc Af Amer >60 >60 mL/min   Anion gap 8 5 - 15  CBC  Result Value Ref Range   WBC 13.5 (H) 3.8 - 10.6 K/uL   RBC 4.80 4.40 - 5.90 MIL/uL   Hemoglobin 12.6 (L) 13.0 - 18.0 g/dL   HCT 91.4 (L) 78.2 - 95.6 %   MCV 78.8 (L) 80.0 - 100.0 fL   MCH 26.2 26.0 - 34.0 pg   MCHC 33.3 32.0 - 36.0 g/dL   RDW 21.3 (H) 08.6 - 57.8 %   Platelets 260 150 - 440 K/uL

## 2017-03-16 NOTE — ED Triage Notes (Addendum)
Pt to ED reporting headache, weakness and feeling as though his BP is elevated. PT has a hx of HTN and hx of a stroke this year in July. Pt reports he has been running low on BP medication and used last pill yesterday. PT denies changes in vision. No neuro deficits noted at this time. Speech is clear and no facial droop noted.

## 2017-03-16 NOTE — ED Provider Notes (Signed)
[facility] Emergency Department Provider Note  ____________________________________________   First MD Initiated Contact with Patient 03/16/17 Paulo Fruit     (approximate)  I have reviewed the triage vital signs and the nursing notes.   HISTORY  Chief Complaint Headache    HPI Gordon Freeman is a 46 y.o. male who comes to the emergency department requesting medication refill. In May of this year he had a lacunar infarct and was diagnosed with hypertension. He is supposed to be taking amlodipine as well as atorvastatin and aspirin however he has had insurance issues and has been nearly out of his medications recently. He has been taking them every 2-3 days to try to straighten them out. Today he took his last dose of came to the emergency department because he was concerned. He does have some mild gradual onset not maximal onset bifrontal throbbing headache which is similar to previous headaches. He does have residual left-sided deficits. He is concerned that with elevated blood pressure he will die.   Past Medical History:  Diagnosis Date  . Hypertension   . Stroke [facility])     Patient Active Problem List   Diagnosis Date Noted  . CVA (cerebral vascular accident) (HCC) 11/09/2016    History reviewed. No pertinent surgical history.  Prior to Admission medications   Medication Sig Start Date End Date Taking? Authorizing Provider  amLODipine (NORVASC) 10 MG tablet Take 1 tablet (10 mg total) by mouth daily. 03/16/17   Merrily Brittle, MD  aspirin EC 81 MG tablet Take 1 tablet (81 mg total) by mouth daily. 11/10/16   Altamese Dilling, MD  atorvastatin (LIPITOR) 40 MG tablet Take 1 tablet (40 mg total) by mouth daily at 6 PM. 03/16/17   Merrily Brittle, MD  nicotine (NICODERM CQ - DOSED IN MG/24 HOURS) 21 mg/24hr patch Place 1 patch (21 mg total) onto the skin daily. 11/11/16   Altamese Dilling, MD    Allergies Patient has no known allergies.  History  reviewed. No pertinent family history.  Social History Social History  Substance Use Topics  . Smoking status: Current Every Day Smoker    Packs/day: 0.25    Types: Cigarettes  . Smokeless tobacco: Never Used  . Alcohol use No    Review of Systems Constitutional: No fever/chills Eyes: No visual changes. ENT: No sore throat. Cardiovascular: Denies chest pain. Respiratory: Denies shortness of breath. Gastrointestinal: No abdominal pain.  No nausea, no vomiting.  No diarrhea.  No constipation. Genitourinary: Negative for dysuria. Musculoskeletal: Negative for back pain. Skin: Negative for rash. Neurological: positive for headache.   ____________________________________________   PHYSICAL EXAM:  VITAL SIGNS: ED Triage Vitals  Enc Vitals Group     BP 03/16/17 1603 (!) 172/88     Pulse Rate 03/16/17 1603 82     Resp 03/16/17 1603 16     Temp 03/16/17 1603 98.9 F (37.2 C)     Temp Source 03/16/17 1603 Oral     SpO2 03/16/17 1603 100 %     Weight 03/16/17 1604 (!) 325 lb (147.4 kg)     Height 03/16/17 1604  (1.854 m)     Head Circumference --      Peak Flow --      Pain Score 03/16/17 1831 10     Pain Loc --      Pain Edu? --      Excl. in GC? --     Constitutional: alert and oriented 4 very well-appearing nontoxic no  diaphoresis speaks in full clear sentences Eyes: PERRL EOMI. Head: Atraumatic. Nose: No congestion/rhinnorhea. Mouth/Throat: No trismus Neck: No stridor.   Cardiovascular: Normal rate, regular rhythm. Grossly normal heart sounds.  Good peripheral circulation. Respiratory: Normal respiratory effort.  No retractions. Lungs CTAB and moving good air Gastrointestinal: soft nondistended nontender no rebound no guarding no peritonitis Musculoskeletal: No lower extremity edema   Neurologic:  Normal speech and language. mild pronator drift on the left although otherwise neurologically intact Skin:  Skin is warm, dry and intact. No rash  noted. Psychiatric: Mood and affect are normal. Speech and behavior are normal.    ____________________________________________   DIFFERENTIAL includes but not limited to  medication noncompliance, stroke, intracerebral hemorrhage ____________________________________________   LABS (all labs ordered are listed, but only abnormal results are displayed)  Labs Reviewed  BASIC METABOLIC PANEL - Abnormal; Notable for the following:       Result Value   Potassium 3.3 (*)    Glucose, Bld 109 (*)    All other components within normal limits  CBC - Abnormal; Notable for the following:    WBC 13.5 (*)    Hemoglobin 12.6 (*)    HCT 37.8 (*)    MCV 78.8 (*)    RDW 16.8 (*)    All other components within normal limits    blood work interpreted by me no acute disease __________________________________________  EKG  ED ECG REPORT I, Merrily Brittle, the attending physician, personally viewed and interpreted this ECG.  Date: 03/16/2017 EKG Time:  Rate: 78 Rhythm: normal sinus rhythm QRS Axis: normal Intervals: normal ST/T Wave abnormalities: normal Narrative Interpretation: no evidence of acute ischemia  ____________________________________________  RADIOLOGY   ____________________________________________   PROCEDURES  Procedure(s) performed: no  Procedures  Critical Care performed: no  Observation: no ____________________________________________   INITIAL IMPRESSION / ASSESSMENT AND PLAN / ED COURSE  Pertinent labs & imaging results that were available during my care of the patient were reviewed by me and considered in my medical decision making (see chart for details).  the patient arrives hemodynamically stable and well-appearing with a stable neurological exam. His headache is concerning and he does not warrant advanced imaging at this time. I will give him a single dose of his medications now and refill them. He is given reassurance and strict return  precautions. He is discharged home in improved condition.      ____________________________________________   FINAL CLINICAL IMPRESSION(S) / ED DIAGNOSES  Final diagnoses:  Acute nonintractable headache, unspecified headache type  Medication refill  Hypertension, unspecified type      NEW MEDICATIONS STARTED DURING THIS VISIT:  Discharge Medication List as of 03/16/2017  6:54 PM       Note:  This document was prepared using Dragon voice recognition software and may include unintentional dictation errors.     Merrily Brittle, MD 03/17/17 [phone]

## 2017-03-17 ENCOUNTER — Ambulatory Visit: Payer: Self-pay | Admitting: Urology

## 2017-03-17 VITALS — BP 159/90 | HR 94 | Temp 100.4°F | Wt 330.1 lb

## 2017-03-17 DIAGNOSIS — I6381 Other cerebral infarction due to occlusion or stenosis of small artery: Secondary | ICD-10-CM

## 2017-03-17 MED ORDER — NICOTINE 21 MG/24HR TD PT24: 21.0000 mg | MEDICATED_PATCH | Freq: Every day | TRANSDERMAL | 0 refills | Status: DC

## 2017-03-17 MED ORDER — ATORVASTATIN CALCIUM 40 MG PO TABS
40.0000 mg | ORAL_TABLET | Freq: Every day | ORAL | 0 refills | Status: DC
Start: 2017-03-17 — End: 2017-03-17

## 2017-03-17 MED ORDER — ASPIRIN EC 81 MG PO TBEC: 81.0000 mg | DELAYED_RELEASE_TABLET | Freq: Every day | ORAL | 0 refills | Status: DC

## 2017-03-17 MED ORDER — ASPIRIN EC 81 MG PO TBEC: 81.0000 mg | DELAYED_RELEASE_TABLET | Freq: Every day | ORAL | 0 refills | Status: AC

## 2017-03-17 MED ORDER — AMLODIPINE BESYLATE 10 MG PO TABS
10.0000 mg | ORAL_TABLET | Freq: Every day | ORAL | 0 refills | Status: DC
Start: 2017-03-17 — End: 2017-03-18

## 2017-03-17 MED ORDER — ATORVASTATIN CALCIUM 40 MG PO TABS: 40.0000 mg | ORAL_TABLET | Freq: Every day | ORAL | 0 refills | Status: DC

## 2017-03-17 MED ORDER — AMLODIPINE BESYLATE 10 MG PO TABS
10.0000 mg | ORAL_TABLET | Freq: Every day | ORAL | 0 refills | Status: DC
Start: 2017-03-17 — End: 2017-03-17

## 2017-03-17 MED ORDER — NICOTINE 21 MG/24HR TD PT24: 21.0000 mg | MEDICATED_PATCH | Freq: Every day | TRANSDERMAL | 0 refills | Status: AC
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
## 2017-03-18 ENCOUNTER — Telehealth: Payer: Self-pay

## 2017-03-18 DIAGNOSIS — I10 Essential (primary) hypertension: Secondary | ICD-10-CM

## 2017-03-18 MED ORDER — AMLODIPINE BESYLATE 10 MG PO TABS: 10.0000 mg | ORAL_TABLET | Freq: Every day | ORAL | 0 refills | Status: DC

## 2017-03-18 NOTE — Telephone Encounter (Signed)
Rx was set to print. Resent the rx to [facility].

## 2017-03-18 NOTE — Telephone Encounter (Signed)
-----   Message from Vicie Mutters, [facility] sent at 03/18/2017  8:48 AM EDT ----- Regarding: Please re-send RX Rcv rx for atorvastatin. Amlodipine was written by Dietrich Pates but says "print class" in [facility]. Please re-send.  Thanks! Denice Paradise, PharmD, RPh [facility]) [phone]

## 2017-03-31 ENCOUNTER — Ambulatory Visit: Payer: Self-pay | Admitting: Family Medicine

## 2017-03-31 VITALS — BP 148/84 | HR 82 | Temp 98.5°F | Wt 332.3 lb

## 2017-03-31 DIAGNOSIS — I6381 Other cerebral infarction due to occlusion or stenosis of small artery: Secondary | ICD-10-CM

## 2017-03-31 DIAGNOSIS — I10 Essential (primary) hypertension: Secondary | ICD-10-CM | POA: Insufficient documentation

## 2017-03-31 DIAGNOSIS — E785 Hyperlipidemia, unspecified: Secondary | ICD-10-CM | POA: Insufficient documentation

## 2017-03-31 MED ORDER — PROPRANOLOL HCL 20 MG PO TABS: 20.0000 mg | ORAL_TABLET | Freq: Two times a day (BID) | ORAL | 0 refills | Status: DC

## 2017-03-31 NOTE — Progress Notes (Signed)
Subjective:     Patient ID: Gordon Freeman, male   DOB: December 16, 1970, 46 y.o.   MRN: [id number]  HPI Pt with a h/o CVA comes in with worsening left sided weakness and speech difficulty.  His CVA in the right internal capsule on 11/10/16.  In the past 4 months, he feels like weakness and speech are not getting any better and may be slightly worse.  He has felt lethargic and depressed.  He has topped smoking; is using a nicoderm patch.  Has had high BP for some time.  He ran out of meds and BP was around 200/100.  On meds, BP has run around 150/90.  He gets bad headaches in the back of his head frequently; takes extra ASA to relieve them   Review of Systems     Objective:   Physical Exam A+O.  speech is slurred with words. Conj/OP clear CTA RRR +2 pulses, no edema Neuro: 5-/5 strength in the left arm and leg vs right side. No carotid bruit noted    Assessment:         Plan:     Recent CVA with residual left sided weakness and speech difficulties.  Refer to PT.  Will also see if he can get OT and Speech therapy. Stay on ASA daily  HTN.  Add Propranolol 20 mg BID. Stay on Amlodipine 10 ,mg a day.  Monitor BP and pulse regularly.  Check CBC and Met B.  Stay on Storvastatin 40 mg daily.  Check lipids and liver.  Depression/anxiety.  Discussed starting a SSRI-gave # for [facility].  Will set up counselling with therapist here.  RTC in 2 weeks.

## 2017-03-31 NOTE — Patient Instructions (Signed)
Start new BP medication twice a day.  Stay on all other medications. Monitor BP at least every other day and keep a record of BP and pulse.

## 2017-04-06 ENCOUNTER — Other Ambulatory Visit: Payer: Self-pay

## 2017-04-06 DIAGNOSIS — I10 Essential (primary) hypertension: Secondary | ICD-10-CM

## 2017-04-06 DIAGNOSIS — E785 Hyperlipidemia, unspecified: Secondary | ICD-10-CM

## 2017-04-07 ENCOUNTER — Telehealth: Payer: Self-pay

## 2017-04-07 LAB — CBC WITH DIFFERENTIAL/PLATELET
BASOS ABS: 0 10*3/uL (ref 0.0–0.2)
Basos: 0 %
EOS (ABSOLUTE): 0.3 10*3/uL (ref 0.0–0.4)
Eos: 2 %
HEMOGLOBIN: 12.5 g/dL — AB (ref 13.0–17.7)
Hematocrit: 38.7 % (ref 37.5–51.0)
Immature Grans (Abs): 0 10*3/uL (ref 0.0–0.1)
Immature Granulocytes: 0 %
LYMPHS ABS: 2.2 10*3/uL (ref 0.7–3.1)
Lymphs: 19 %
MCH: 25.4 pg — AB (ref 26.6–33.0)
MCHC: 32.3 g/dL (ref 31.5–35.7)
MCV: 79 fL (ref 79–97)
MONOCYTES: 6 %
Monocytes Absolute: 0.7 10*3/uL (ref 0.1–0.9)
NEUTROS ABS: 8.4 10*3/uL — AB (ref 1.4–7.0)
Neutrophils: 73 %
PLATELETS: 297 10*3/uL (ref 150–379)
RBC: 4.93 x10E6/uL (ref 4.14–5.80)
RDW: 16.6 % — AB (ref 12.3–15.4)
WBC: 11.6 10*3/uL — AB (ref 3.4–10.8)

## 2017-04-07 LAB — COMPREHENSIVE METABOLIC PANEL
ALBUMIN: 4.3 g/dL (ref 3.5–5.5)
ALK PHOS: 78 IU/L (ref 39–117)
ALT: 20 IU/L (ref 0–44)
AST: 15 IU/L (ref 0–40)
Albumin/Globulin Ratio: 1.5 (ref 1.2–2.2)
BILIRUBIN TOTAL: 0.2 mg/dL (ref 0.0–1.2)
BUN / CREAT RATIO: 9 (ref 9–20)
BUN: 9 mg/dL (ref 6–24)
CHLORIDE: 104 mmol/L (ref 96–106)
CO2: 25 mmol/L (ref 20–29)
CREATININE: 1 mg/dL (ref 0.76–1.27)
Calcium: 9.5 mg/dL (ref 8.7–10.2)
GFR calc Af Amer: 105 mL/min/{1.73_m2} (ref >59)
GFR calc non Af Amer: 91 mL/min/{1.73_m2} (ref >59)
GLUCOSE: 99 mg/dL (ref 65–99)
Globulin, Total: 2.9 g/dL (ref 1.5–4.5)
Potassium: 4.2 mmol/L (ref 3.5–5.2)
Sodium: 140 mmol/L (ref 134–144)
Total Protein: 7.2 g/dL (ref 6.0–8.5)

## 2017-04-07 LAB — LIPID PANEL
CHOLESTEROL TOTAL: 146 mg/dL (ref 100–199)
Chol/HDL Ratio: 5 ratio (ref 0.0–5.0)
HDL: 29 mg/dL — AB (ref >39)
LDL CALC: 108 mg/dL — AB (ref 0–99)
TRIGLYCERIDES: 44 mg/dL (ref 0–149)
VLDL CHOLESTEROL CAL: 9 mg/dL (ref 5–40)

## 2017-04-07 NOTE — Telephone Encounter (Signed)
[facility].

## 2017-04-14 ENCOUNTER — Ambulatory Visit: Payer: Self-pay

## 2017-05-03 ENCOUNTER — Ambulatory Visit: Payer: Self-pay | Admitting: Adult Health Nurse Practitioner

## 2017-05-03 VITALS — BP 201/109 | HR 88 | Temp 98.5°F | Wt 336.9 lb

## 2017-05-03 DIAGNOSIS — I10 Essential (primary) hypertension: Secondary | ICD-10-CM

## 2017-05-03 MED ORDER — HYDROCHLOROTHIAZIDE 25 MG PO TABS: 25.0000 mg | ORAL_TABLET | Freq: Every day | ORAL | 3 refills | Status: DC

## 2017-05-03 NOTE — Progress Notes (Signed)
  Patient: Gordon Freeman Male    DOB: May 22, 1971   46 y.o.   MRN: [id number] Visit Date: 05/03/2017  Today's Provider: [facility]   Chief Complaint  Patient presents with  . Follow-up    waiting for physical therapy to contact him / as well as speech therapy   Subjective:    Gordon Freeman is 46 y/o with hx of CVAx1 (10/2016), HTN, HLD here for BP f/u  CVA f/u Internal capsule CVA 10/2016.Pt compliant with medication. Left-sided weakness still same as last office visit. Speech still the same as last office visit. Pt not checking BP at home.  Diet: No red meat. All baked foods and salads. Not adding salt to meals. 1 soda every other day. Exercise: Walks around complex 2-3 times a few times/week. Quit smoking 2 months ago: currently using gum and patches, occasional vape. Plans to start reducing gum/patch use.  Denies HA/migraines since last visit. Denies any new stroke symptoms. Occasional left-sided chest pain with left arm movement, not associated with cardiovascular exertion. Denies shortness of breath.   Depression Endorses ongoing depression since CVA. No SI/HI. Pt waiting to hear from [facility] about therapy.        No Known Allergies This SmartLink is deprecated. Use AVSMEDLIST instead to display the medication list for a patient.  Review of Systems  All other systems reviewed and are negative.   Social History   Tobacco Use  . Smoking status: Current Every Day Smoker    Packs/day: 0.25    Types: E-cigarettes  . Smokeless tobacco: Never Used  . Tobacco comment: using nicorette and nicotine gum  Substance Use Topics  . Alcohol use: No   Objective:   BP (!) 201/109   Pulse 88   Temp 98.5 F (36.9 C)   Wt (!) 336 lb 14.4 oz (152.8 kg)   BMI 44.45 kg/m   Physical Exam  Constitutional: He is oriented to person, place, and time. He appears well-developed and well-nourished.  HENT:  Head: Normocephalic and atraumatic.  Eyes: Conjunctivae are normal.   Cardiovascular: Normal rate, regular rhythm and normal heart sounds. Exam reveals no gallop and no friction rub.  No murmur heard. Pulmonary/Chest: Effort normal and breath sounds normal.  Neurological: He is alert and oriented to person, place, and time. He displays normal reflexes. No cranial nerve deficit. He exhibits normal muscle tone. Coordination normal.  Skin: Skin is warm and dry.        Assessment & Plan:     Gordon Freeman is 46 y/o with hx of CVAx1 (10/2016), HTN, HLD here for BP f/u  HTN BP uncontrolled. Pt to continue current regimen and add HCTZ today. Given red flag symptoms of new HA, weakness, chest pain, new SOB to go to ED, pt verbalizes understanding. F/u in 2 weeks for BP rechecks.       Depression Ongoing post-stroke depression. Denies SI/HI. Pt waiting to hear from [facility].   [facility]   [facility]

## 2017-05-24 ENCOUNTER — Ambulatory Visit: Payer: Self-pay | Admitting: Adult Health Nurse Practitioner

## 2017-05-24 VITALS — BP 145/84 | HR 77 | Temp 98.5°F | Ht 73.5 in | Wt 333.4 lb

## 2017-05-24 DIAGNOSIS — I10 Essential (primary) hypertension: Secondary | ICD-10-CM

## 2017-05-24 NOTE — Progress Notes (Signed)
  Patient: Gordon Freeman Male    DOB: [birth year]   46 y.o.   MRN: [id number] Visit Date: 05/24/2017  Today's Provider: Michiel CowboySHANNON MCGOWAN, PA-C   Chief Complaint  Patient presents with  . Follow-up    Complaining of left sided weakness (stroke in May)    Subjective:    Gordon Freeman is 46 y/o with hx of CVAx1 (10/2016), HTN, HLD here for BP f/u  HTN Adherent to meds. [facility]; runs around 150/70s. Diet: Besides Thanksgiving, diet is good.No salt. Baked/grilled foods. Exercise: Walks throughout day at work. Nothing outside of work.  No improvement to strength/sensation/speech.  No chest pains. No SOB. No new stroke symptoms.   Sexual dysfunction Over last few months, reports sexual dysfunction. Fewer morning erections compared to previously.   Depressed mood Mood sometime down due to physical limitations. Still enjoys hobbies. No SI/HI.  Still waiting on [facility].         No Known Allergies This SmartLink is deprecated. Use AVSMEDLIST instead to display the medication list for a patient.  Review of Systems  All other systems reviewed and are negative.   Social History   Tobacco Use  . Smoking status: Current Every Day Smoker    Packs/day: 0.25    Types: E-cigarettes  . Smokeless tobacco: Never Used  . Tobacco comment: using nicorette and nicotine gum  Substance Use Topics  . Alcohol use: No   Objective:   BP (!) 145/84 (BP Location: Left Arm)   Pulse 77   Temp 98.5 F (36.9 C) (Oral)   Ht 6' 1.5" (1.867 m)   Wt (!) 333 lb 6.4 oz (151.2 kg)   BMI 43.39 kg/m   Physical Exam  Constitutional: He is oriented to person, place, and time. He appears well-developed and well-nourished.  HENT:  Head: Normocephalic and atraumatic.  Eyes: Conjunctivae are normal.  Cardiovascular: Normal rate, regular rhythm and normal heart sounds.  Pulmonary/Chest: Effort normal and breath sounds normal.  Neurological: He is alert and oriented to person, place, and  time. He displays no atrophy. No cranial nerve deficit or sensory deficit. Coordination and gait normal.  4+ strength LUE, 5+ strength grossly other extremities  Skin: Skin is warm and dry.  Psychiatric: He has a normal mood and affect.        Assessment & Plan:     Gordon Freeman is 46 y/o with hx of CVAx1 (10/2016), HTN, HLD here for BP f/u  HTN BP improved today at 145/84. Pt to continue current regimen.   Sexual dysfunction Etiology likely multifactorial: HTN/HLD as well as depressed mood. Encouraged patient to continue good diet and medical regimen. Encouraged pt to follow-up with [facility].   Depressed mood Pt still waiting to hear from [facility]. Pt to follow up with Marinell BlightHeather      SHANNON MCGOWAN, PA-C   [facility]

## 2017-05-25 ENCOUNTER — Ambulatory Visit: Payer: Self-pay | Admitting: Licensed Clinical Social Worker

## 2017-05-31 ENCOUNTER — Ambulatory Visit: Payer: Self-pay | Admitting: Licensed Clinical Social Worker

## 2017-07-08 ENCOUNTER — Other Ambulatory Visit: Payer: Self-pay | Admitting: Adult Health Nurse Practitioner

## 2017-07-08 ENCOUNTER — Other Ambulatory Visit: Payer: Self-pay | Admitting: Internal Medicine

## 2017-07-08 DIAGNOSIS — I10 Essential (primary) hypertension: Secondary | ICD-10-CM

## 2017-07-24 ENCOUNTER — Other Ambulatory Visit: Payer: Self-pay

## 2017-07-24 ENCOUNTER — Emergency Department
Admission: EM | Admit: 2017-07-24 | Discharge: 2017-07-24 | Disposition: A | Discharge status: Home / Self Care | Payer: No Typology Code available for payment source | Attending: Emergency Medicine | Admitting: Emergency Medicine

## 2017-07-24 DIAGNOSIS — Z8673 Personal history of transient ischemic attack (TIA), and cerebral infarction without residual deficits: Secondary | ICD-10-CM | POA: Insufficient documentation

## 2017-07-24 DIAGNOSIS — F17210 Nicotine dependence, cigarettes, uncomplicated: Secondary | ICD-10-CM | POA: Diagnosis not present

## 2017-07-24 DIAGNOSIS — I10 Essential (primary) hypertension: Secondary | ICD-10-CM | POA: Insufficient documentation

## 2017-07-24 DIAGNOSIS — M25512 Pain in left shoulder: Secondary | ICD-10-CM | POA: Diagnosis not present

## 2017-07-24 DIAGNOSIS — Y92410 Unspecified street and highway as the place of occurrence of the external cause: Secondary | ICD-10-CM | POA: Diagnosis not present

## 2017-07-24 DIAGNOSIS — Z79899 Other long term (current) drug therapy: Secondary | ICD-10-CM | POA: Diagnosis not present

## 2017-07-24 DIAGNOSIS — S1980XA Other specified injuries of unspecified part of neck, initial encounter: Secondary | ICD-10-CM | POA: Diagnosis present

## 2017-07-24 DIAGNOSIS — S161XXA Strain of muscle, fascia and tendon at neck level, initial encounter: Secondary | ICD-10-CM | POA: Diagnosis not present

## 2017-07-24 DIAGNOSIS — Z7982 Long term (current) use of aspirin: Secondary | ICD-10-CM | POA: Insufficient documentation

## 2017-07-24 DIAGNOSIS — Y9389 Activity, other specified: Secondary | ICD-10-CM | POA: Diagnosis not present

## 2017-07-24 DIAGNOSIS — Y999 Unspecified external cause status: Secondary | ICD-10-CM | POA: Insufficient documentation

## 2017-07-24 MED ORDER — IBUPROFEN 600 MG PO TABS: 600.0000 mg | ORAL_TABLET | Freq: Three times a day (TID) | ORAL | 0 refills | Status: AC | PRN

## 2017-07-24 MED ORDER — HYDROCODONE-ACETAMINOPHEN 5-325 MG PO TABS
1.0000 | ORAL_TABLET | Freq: Once | ORAL | Status: AC
  Administered 2017-07-24: 1 via ORAL
  Filled 2017-07-24: qty 1

## 2017-07-24 MED ORDER — IBUPROFEN 600 MG PO TABS
600.0000 mg | ORAL_TABLET | Freq: Once | ORAL | Status: AC
  Administered 2017-07-24: 600 mg via ORAL
  Filled 2017-07-24: qty 1

## 2017-07-24 NOTE — Discharge Instructions (Signed)
Fortunately today you did not break any bones.  It is normal for your pain to be even worse tomorrow and the next day, so please take ibuprofen three times a day as needed for pain control and make sure you take it easy.  Return to the ED for any concerns.  It was a pleasure to take care of you today, and thank you for coming to our emergency department.  If you have any questions or concerns before leaving please ask the nurse to grab me and I'm more than happy to go through your aftercare instructions again.  If you were prescribed any opioid pain medication today such as Norco, Vicodin, Percocet, morphine, hydrocodone, or oxycodone please make sure you do not drive when you are taking this medication as it can alter your ability to drive safely.  If you have any concerns once you are home that you are not improving or are in fact getting worse before you can make it to your follow-up appointment, please do not hesitate to call 911 and come back for further evaluation.  Merrily BrittleNeil Adorian Gwynne, MD
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
## 2017-08-23 ENCOUNTER — Ambulatory Visit: Payer: Self-pay

## 2019-01-31 IMAGING — CT CT HEAD W/O CM
3 series · 15 of 47 positions shown, 18 images · non-contrast
Comparison: None.

CLINICAL DATA: Chest pain and LEFT facial droop beginning
yesterday, LEFT arm weakness. History of hypertension.

EXAM:
CT HEAD WITHOUT CONTRAST
TECHNIQUE: Contiguous axial images were obtained from the base of the skull
through the vertex without intravenous contrast.

[Series 2: head wo · axial · 0.43mm/px · z∈[-62,+73]mm · 9 of 33 slices shown, 12 images]
[im 3/33  brain]
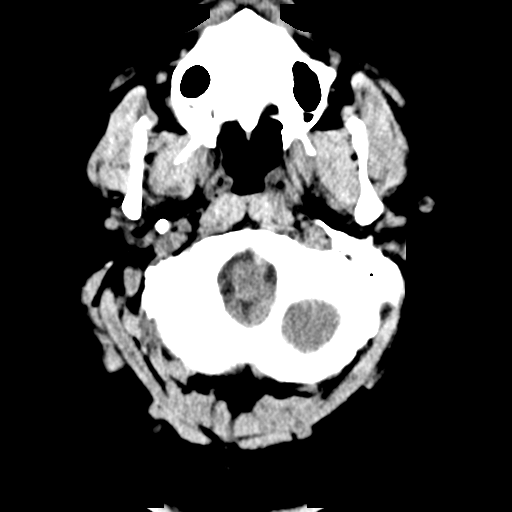
[im 3/33  bone]
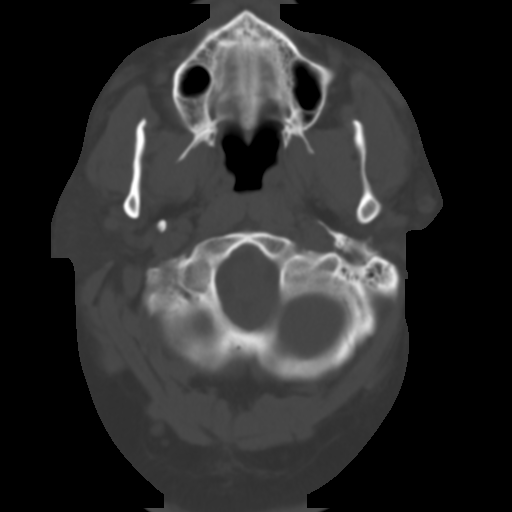
[im 6/33  brain]
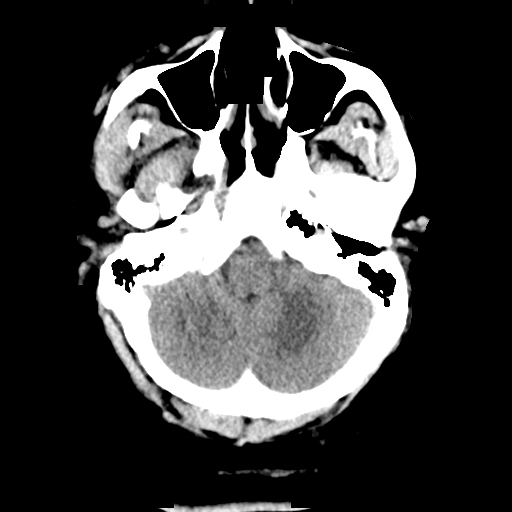
[im 9/33  brain]
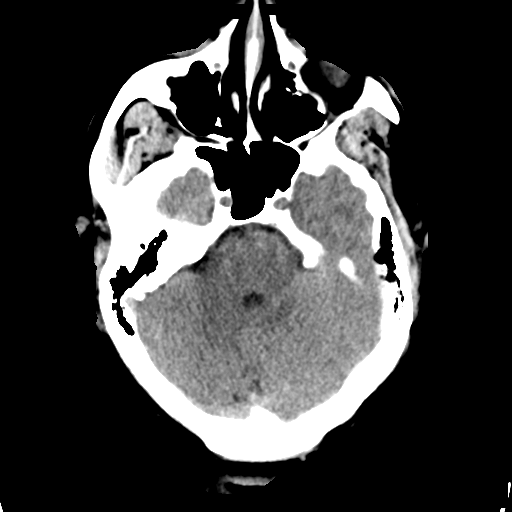
[im 13/33  brain]
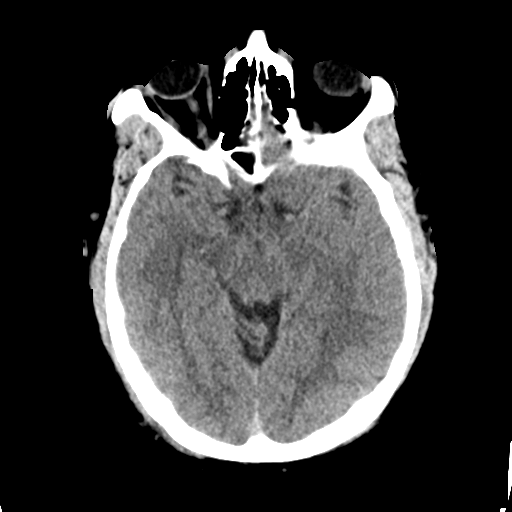
[im 17/33  brain]
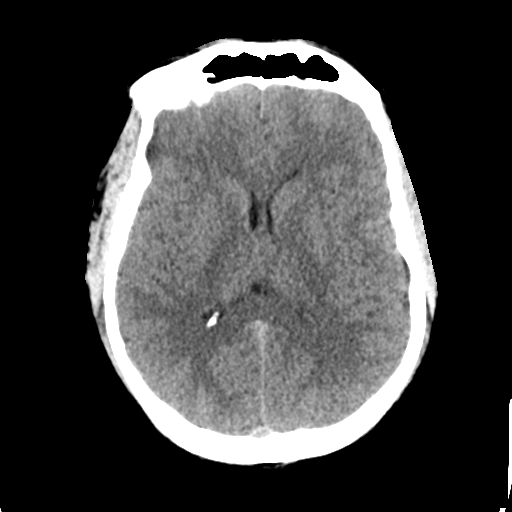
[im 17/33  bone]
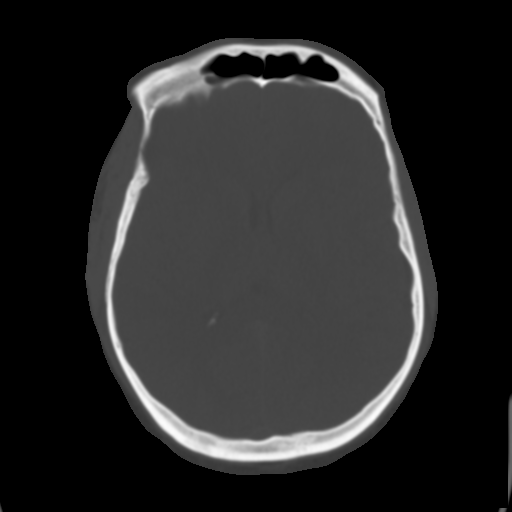
[im 20/33  brain]
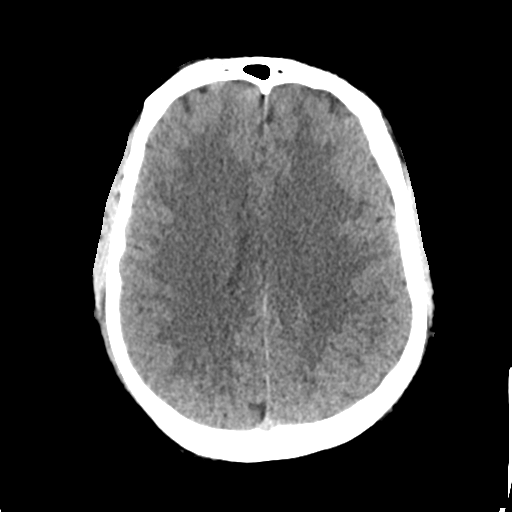
[im 24/33  brain]
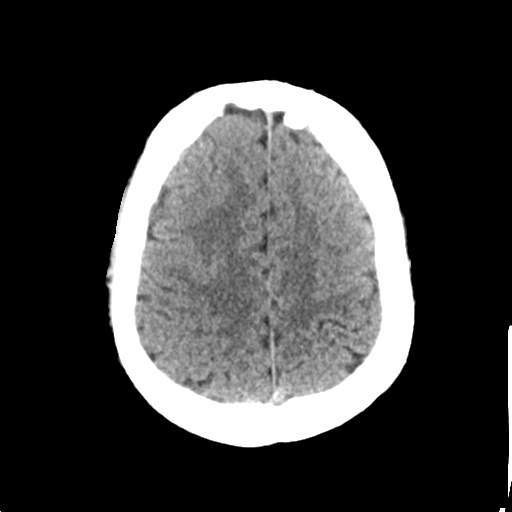
[im 27/33  brain]
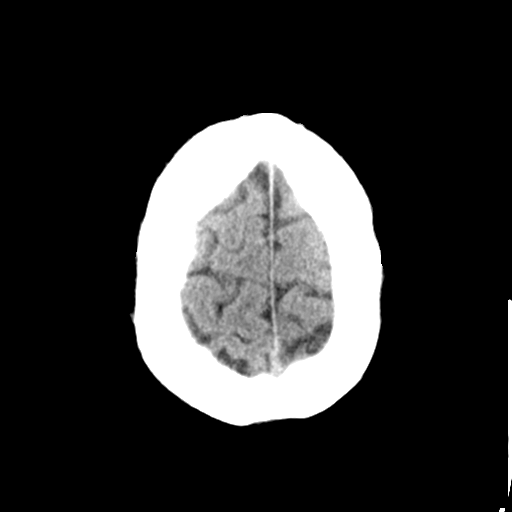
[im 30/33  brain]
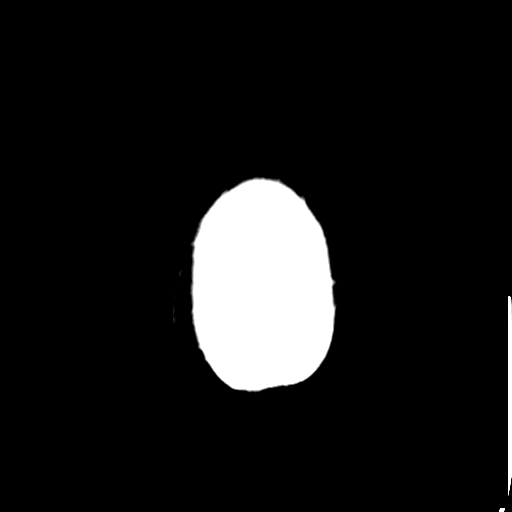
[im 30/33  bone]
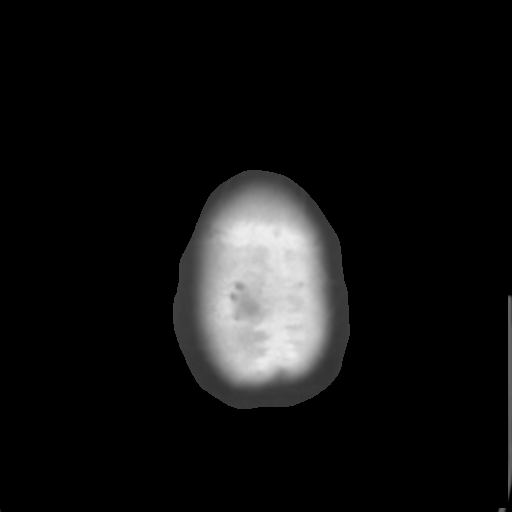

[Series 4: coronal soft tissue · coronal · 0.32mm/px · 3 of 70 slices shown]
[im 24/70  brain]
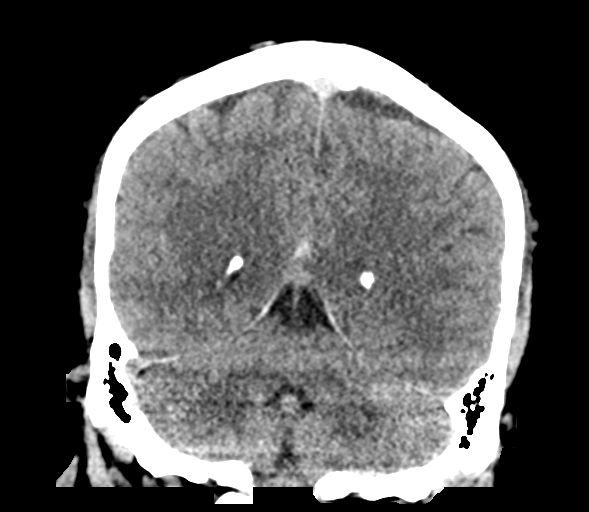
[im 31/70  brain]
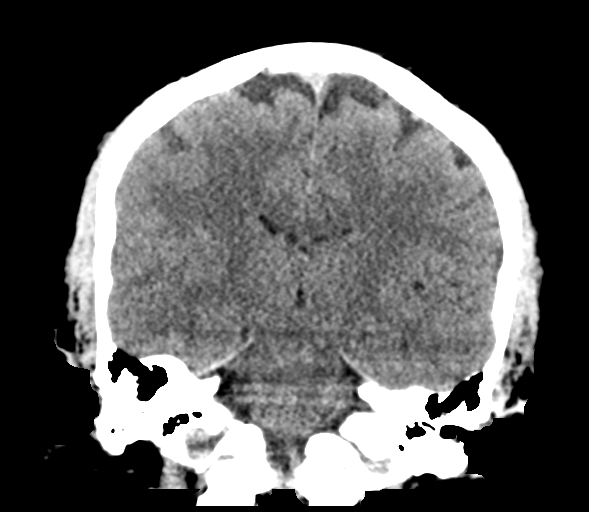
[im 39/70  brain]
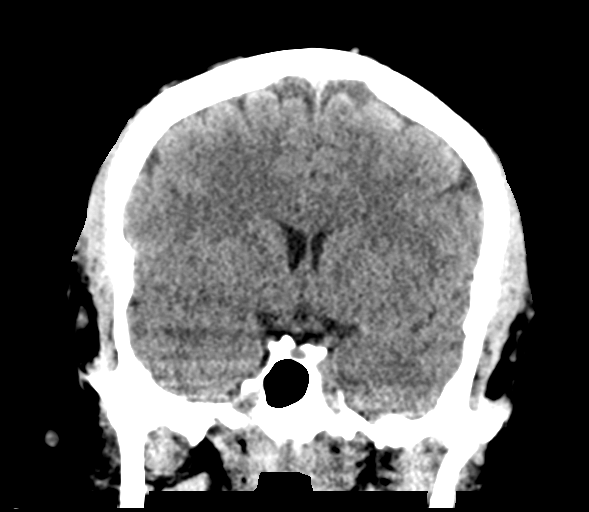

[Series 5: sagittal soft tissue · sagittal · 0.33mm/px · 3 of 56 slices shown]
[im 19/56  brain]
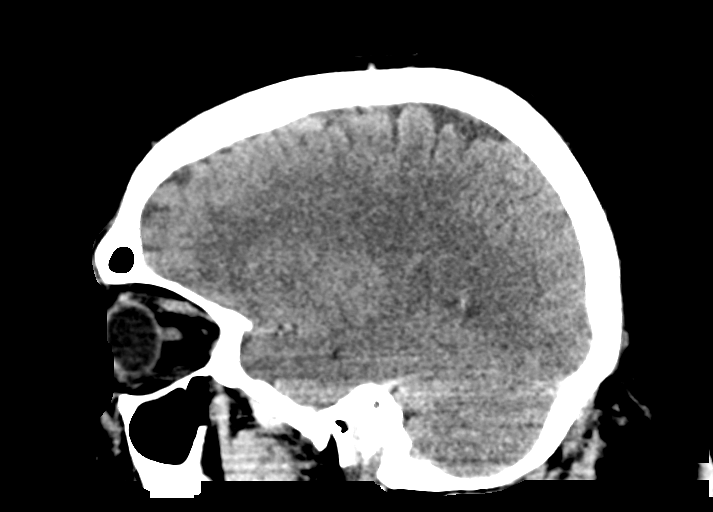
[im 28/56  brain]
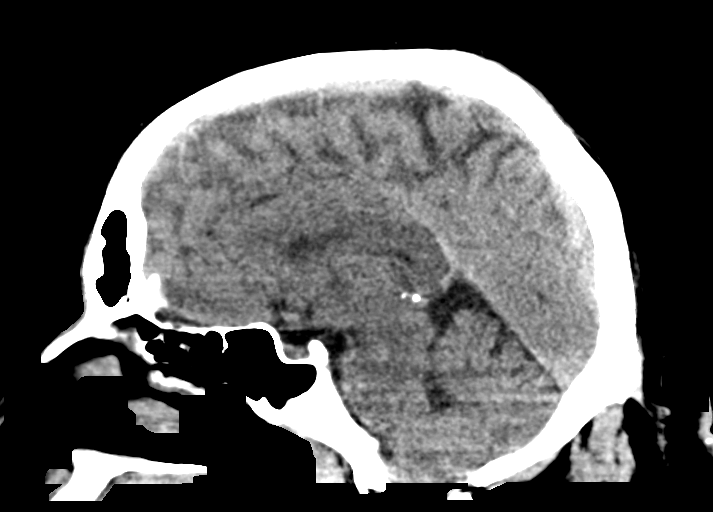
[im 37/56  brain]
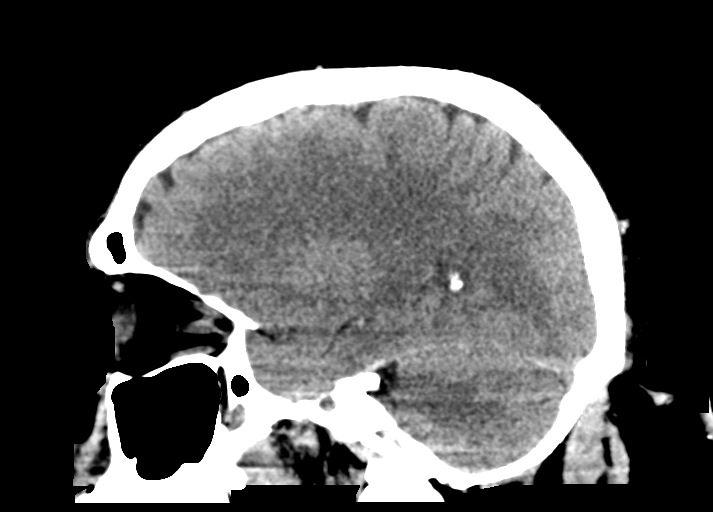

[15 of 47 positions shown; findings below may reference images not displayed]

FINDINGS: BRAIN: No intraparenchymal hemorrhage, mass effect nor midline
shift. The ventricles and sulci are normal. No acute large vascular
territory infarcts. No abnormal extra-axial fluid collections. Basal
cisterns are patent.

VASCULAR: Trace calcific atherosclerosis carotid siphon.

SKULL/SOFT TISSUES: No skull fracture. No significant soft tissue
swelling.

ORBITS/SINUSES: The included ocular globes and orbital contents are
normal.The mastoid aircells and included paranasal sinuses are
well-aerated.

OTHER: None.
IMPRESSION: Negative noncontrast CT HEAD.

## 2019-09-21 ENCOUNTER — Ambulatory Visit: Payer: Self-pay

## 2021-12-11 ENCOUNTER — Ambulatory Visit: Payer: Self-pay

## 2022-02-01 ENCOUNTER — Ambulatory Visit: Payer: Self-pay

## 2022-02-03 ENCOUNTER — Ambulatory Visit: Payer: Self-pay | Admitting: Family Medicine

## 2022-02-03 ENCOUNTER — Encounter: Payer: Self-pay | Admitting: Nurse Practitioner

## 2022-02-03 DIAGNOSIS — Z202 Contact with and (suspected) exposure to infections with a predominantly sexual mode of transmission: Secondary | ICD-10-CM

## 2022-02-03 MED ORDER — METRONIDAZOLE 500 MG PO TABS: 500.0000 mg | ORAL_TABLET | Freq: Two times a day (BID) | ORAL | 0 refills | Status: AC

## 2022-02-03 NOTE — Progress Notes (Signed)
Pt reports exposure to trichomonas. States mild itching only. Pt wants treatment only and no testing done. Denies wanting urine testing or blood testing of any kind. Pt verbalized understanding of medication teaching.

## 2022-02-04 ENCOUNTER — Encounter: Payer: Self-pay | Admitting: Family Medicine

## 2022-02-04 NOTE — Progress Notes (Signed)
Event organiser for clinical support staff: I agree with the care provided to this patient and was available for consultation via phone.  Our agency had providers call out and nursing staff worked under standing orders and with remote consult to provide services for the community.  Patient appropriately treated for Trich per exposure.   Federico Flake, MD, MPH, [facility] Medical Director

## 2022-08-05 DIAGNOSIS — Q631 Lobulated, fused and horseshoe kidney: Secondary | ICD-10-CM | POA: Diagnosis not present

## 2022-08-05 DIAGNOSIS — E785 Hyperlipidemia, unspecified: Secondary | ICD-10-CM | POA: Diagnosis not present

## 2022-08-05 DIAGNOSIS — R1032 Left lower quadrant pain: Secondary | ICD-10-CM | POA: Diagnosis not present

## 2022-08-05 DIAGNOSIS — R1031 Right lower quadrant pain: Secondary | ICD-10-CM | POA: Diagnosis not present

## 2022-08-05 DIAGNOSIS — Z8673 Personal history of transient ischemic attack (TIA), and cerebral infarction without residual deficits: Secondary | ICD-10-CM | POA: Diagnosis not present

## 2022-08-05 DIAGNOSIS — Z79899 Other long term (current) drug therapy: Secondary | ICD-10-CM | POA: Diagnosis not present

## 2022-08-05 DIAGNOSIS — F17210 Nicotine dependence, cigarettes, uncomplicated: Secondary | ICD-10-CM | POA: Diagnosis not present

## 2022-08-05 DIAGNOSIS — N200 Calculus of kidney: Secondary | ICD-10-CM | POA: Diagnosis not present

## 2022-08-05 DIAGNOSIS — Z6839 Body mass index (BMI) 39.0-39.9, adult: Secondary | ICD-10-CM | POA: Diagnosis not present

## 2022-08-05 DIAGNOSIS — Z7289 Other problems related to lifestyle: Secondary | ICD-10-CM | POA: Diagnosis not present

## 2022-08-05 DIAGNOSIS — Z7982 Long term (current) use of aspirin: Secondary | ICD-10-CM | POA: Diagnosis not present

## 2022-08-05 DIAGNOSIS — F1290 Cannabis use, unspecified, uncomplicated: Secondary | ICD-10-CM | POA: Diagnosis not present

## 2022-08-05 DIAGNOSIS — N132 Hydronephrosis with renal and ureteral calculous obstruction: Secondary | ICD-10-CM | POA: Diagnosis not present

## 2022-08-05 DIAGNOSIS — R69 Illness, unspecified: Secondary | ICD-10-CM | POA: Diagnosis not present

## 2022-08-05 DIAGNOSIS — I10 Essential (primary) hypertension: Secondary | ICD-10-CM | POA: Diagnosis not present

## 2022-11-03 ENCOUNTER — Emergency Department (HOSPITAL_COMMUNITY)
Admission: EM | Admit: 2022-11-03 | Discharge: 2022-11-04 | Discharge status: Against Medical Advice | Payer: Self-pay | Attending: Emergency Medicine | Admitting: Emergency Medicine

## 2022-11-03 ENCOUNTER — Other Ambulatory Visit: Payer: Self-pay

## 2022-11-03 DIAGNOSIS — R0602 Shortness of breath: Secondary | ICD-10-CM | POA: Insufficient documentation

## 2022-11-03 DIAGNOSIS — Z5321 Procedure and treatment not carried out due to patient leaving prior to being seen by health care provider: Secondary | ICD-10-CM | POA: Insufficient documentation

## 2022-11-03 DIAGNOSIS — R200 Anesthesia of skin: Secondary | ICD-10-CM | POA: Insufficient documentation

## 2022-11-03 DIAGNOSIS — R079 Chest pain, unspecified: Secondary | ICD-10-CM | POA: Insufficient documentation

## 2022-11-03 NOTE — ED Notes (Signed)
Pt called multiple times to be triaged no answer

## 2022-11-03 NOTE — ED Notes (Signed)
No answer from pt in waiting room
